# Patient Record
Sex: Male | Born: 1995 | Race: White | Hispanic: No | Marital: Single | Smoking: Never smoker
Health system: Southern US, Community
[De-identification: ages and names within clinical notes are randomized; demographics above are authoritative.]

## PROBLEM LIST (undated history)

## (undated) DIAGNOSIS — R569 Unspecified convulsions: Secondary | ICD-10-CM

## (undated) DIAGNOSIS — K5909 Other constipation: Secondary | ICD-10-CM

## (undated) DIAGNOSIS — K219 Gastro-esophageal reflux disease without esophagitis: Secondary | ICD-10-CM

## (undated) DIAGNOSIS — F840 Autistic disorder: Secondary | ICD-10-CM

## (undated) DIAGNOSIS — F419 Anxiety disorder, unspecified: Secondary | ICD-10-CM

## (undated) HISTORY — DX: Autistic disorder: F84.0

## (undated) HISTORY — DX: Other constipation: K59.09

## (undated) HISTORY — DX: Gastro-esophageal reflux disease without esophagitis: K21.9

## (undated) HISTORY — DX: Unspecified convulsions: R56.9

## (undated) HISTORY — PX: WISDOM TOOTH EXTRACTION: SHX21

## (undated) HISTORY — DX: Anxiety disorder, unspecified: F41.9

---
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
## 2014-01-01 NOTE — Assessment & Plan Note (Signed)
Refilled Miralax today.

## 2014-01-06 ENCOUNTER — Telehealth: Payer: Self-pay | Admitting: *Deleted

## 2014-01-06 NOTE — Telephone Encounter (Signed)
Antonio Silva, mom, states that the patient is completely out of hydroxyzine and needs a prior authorization. She stated the pharmacy gave her just enough for a few days and does not know if he has enough for tonight. He has had some sleeping problems. The mother can be reached at [phone].

## 2014-01-06 NOTE — Telephone Encounter (Signed)
Thank you for taking care of that. TG

## 2014-01-06 NOTE — Telephone Encounter (Signed)
[facility] The medication does not require PA, just needs refill authorization. I gave verbal authorization to refill this one time. Pt has appt w Dr. Rexene EdisonH tomorrow. Called mom and lvm to let her know [facility] and reminded her of pt's appt tomorrow at 12 pm w arrival at 11:45 am.

## 2014-01-07 ENCOUNTER — Encounter: Payer: Self-pay | Admitting: Pediatrics

## 2014-01-07 ENCOUNTER — Ambulatory Visit (INDEPENDENT_AMBULATORY_CARE_PROVIDER_SITE_OTHER): Payer: Medicaid Other | Admitting: Pediatrics

## 2014-01-07 VITALS — Ht 67.0 in | Wt 170.0 lb

## 2014-01-07 DIAGNOSIS — G40309 Generalized idiopathic epilepsy and epileptic syndromes, not intractable, without status epilepticus: Secondary | ICD-10-CM

## 2014-01-07 DIAGNOSIS — K219 Gastro-esophageal reflux disease without esophagitis: Secondary | ICD-10-CM

## 2014-01-07 DIAGNOSIS — F411 Generalized anxiety disorder: Secondary | ICD-10-CM

## 2014-01-07 DIAGNOSIS — F840 Autistic disorder: Secondary | ICD-10-CM

## 2014-01-07 DIAGNOSIS — K5900 Constipation, unspecified: Secondary | ICD-10-CM

## 2014-01-07 DIAGNOSIS — F419 Anxiety disorder, unspecified: Secondary | ICD-10-CM

## 2014-01-07 DIAGNOSIS — K5909 Other constipation: Secondary | ICD-10-CM

## 2014-01-07 MED ORDER — SERTRALINE HCL 20 MG/ML PO CONC: ORAL | Status: DC

## 2014-01-07 NOTE — Progress Notes (Signed)
Patient: Antonio Silva MRN: [id number] Sex: male DOB: Jun 13, 1996  Provider: Deetta PerlaHICKLING,Antonio H, MD [facility]  Note type: Routine return visit  History of Present Illness: Referral Source: [facility]ce Silva [facility] History from: mother and CAPs worker and [facility] Chief Complaint: Autism/Seizures   Antonio Silva is a 18 y.o. male who eturns for evaluation and management of autism, anxiety, and seizures.  Antonio Silva returns on January 07, 2014 for the first time since May 24, 2013.  He has a history of generalized tonic-clonic seizures and undifferentiated autism.  He was placed on lamotrigine as result of seizures and this has completely controlled them.  Unfortunately, with control of his seizures, he has become more anxious and more agitated, and more difficult to control.  When anxious he paces, he shakes, he engages in self-injurious, and at times aggressive behavior.  He will slap and scream.  Sometimes the episodes are quite brief and Silva times they can last for a half hour to an hour.  He has stereotypic behavior of pushing his fingers into his closed eyelids.  His mother and caregiver are worried that he is going to injure himself.    He has been treated with 5 mg of Valium on days when he is agitated and this seems to work quite well.  He becomes calm and not sleepy.  His mother and caregiver know that this, however, could be problematic if given daily.  He seems to them to be excessively sleepy.  He may sleep a full night and then sleep for three to four hours further during the day.  He is on number of medications that might be sleep inducing.  During the week day, his caregiver pushes him to get his day program.  On the weekends, he tends to sleep in.  Overall he seems to have less motivation and appears to be less happy.    He is followed by [facility].  Dr. Adriana Silva was not able to recently assess him because he  refused to enter the clinic.  He had a similar problem today, but we were able to get him into the office.  I blocked the door so he could not leave.  He paced back and forth and then he began to cry.  It seems to me that he was quite frustrated that he could not leave, but fortunately he did not become aggressive.  He did not, however, allow me to examine him Silva than in the most superficial way.  Once we were finished, he broke into a smile and was only too happy to leave the office.  Review of Systems: 12 system review was remarkable for anxiety and excessive sleep  Past Medical History  Diagnosis Date  . Seizures   . Autistic disorder, current or active state   . GERD (gastroesophageal reflux disease)   . Chronic constipation   . Anxiety    Hospitalizations: no, Head Injury: no, Nervous System Infections: no, Immunizations up to date: yes Past Medical History Diagnosis of autism was made when the patient was 527 months of age.  His most recent non-verbal IQ was estimated at 5142.  Fractured arm at 9.  Hospitalization due to fecal impaction for one night  Allergies to dust mites and mold.  Behavior issues have worsened during puberty.  Significant problems with constipation and takes MiraLAX twice daily.   Birth History 6 lbs. 11 oz. infant born at full-term  to a 19 year old primigravida.  Mother gained 30 pounds  Labor lasted for 10 hours  Normal spontaneous vaginal delivery  Delayed developmental milestones particularly for language.  Behavior History the patient is anxious, as low frustration tolerance, has a strong eversion to change in routine and new settings, and becomes violent when he is upset.  Surgical History No past surgical history on file.  Family History family history includes Asthma in his mother; Breast cancer in his paternal grandmother; Depression in his mother; Diabetes in his mother; Heart attack in his maternal grandfather and maternal grandmother;  Hyperlipidemia in his mother; Hypertension in his mother; Ulcerative colitis in his father. Family History is negative for migraines, seizures, intellectual disability, blindness, deafness, birth defects, chromosomal disorder, or autism.  Social History History   Social History  . Marital Status: Single    Spouse Name: N/A    Number of Children: N/A  . Years of Education: N/A   Social History Main Topics  . Smoking status: Never Smoker   . Smokeless tobacco: Never Used  . Alcohol Use: No  . Drug Use: No  . Sexual Activity: No   Silva Topics Concern  . None   Social History Narrative  . None   Educational level special education Living with both parents  Hobbies/Interest: Enjoys walking and community activities. School comments Antonio Silva attends [employer], he has days where he does well and Silva days not so well.   Current Outpatient Prescriptions on File Prior to Visit  Medication Sig Dispense Refill  . diazepam (VALIUM) 5 MG tablet TAKE 1 TABLET BY MOUTH EVERY 6 HOURS AS NEEDED FOR ANXIETY  30 tablet  0  . hydrOXYzine (ATARAX) 10 MG/5ML syrup Give 25 ml at bedtime  775 mL  5  . lamoTRIgine (LAMICTAL) 25 MG tablet 3 BY MOUTH TWICE A DAY  186 tablet  1  . lansoprazole (PREVACID SOLUTAB) 30 MG disintegrating tablet Take 30 mg by mouth daily.      . polyethylene glycol (MIRALAX / GLYCOLAX) packet Take 17 g by mouth 2 (two) times daily.      . risperiDONE (RISPERDAL) 1 MG/ML oral solution TAKE 1.5 MLS BY MOUTH EVERY MORNING AT 7:00 AM AND 5:00 PM.  90 mL  5  . sertraline (ZOLOFT) 20 MG/ML concentrated solution GIVE 1 ML BY MOUTH EVERY MORNING AND 0.5 ML BY MOUTH EVERY NIGHT AT BEDTIME.  60 mL  2  . lansoprazole (PREVACID SOLUTAB) 30 MG disintegrating tablet Take 1 tablet (30 mg total) by mouth daily.  90 tablet  3  . polyethylene glycol powder (GLYCOLAX/MIRALAX) powder Take 17 g by mouth 2 (two) times daily as needed.  3350 g  1  . risperiDONE (RISPERDAL) 1 MG/ML oral  solution TAKE 1.5 MLS BY MOUTH EVERY MORNING AT 7:00 AM AND 5:00 PM.  90 mL  1   No current facility-administered medications on file prior to visit.   The medication list was reviewed and reconciled. All changes or newly prescribed medications were explained.  A complete medication list was provided to the patient/caregiver.  Allergies  Allergen Reactions  . Silva     Lactose    Physical Exam Ht 5\' 7"  (1.702 m)  Wt 170 lb (77.111 kg)  BMI 26.62 kg/m2  It was difficult to get the patient into the room.  He would not sit down and continued to pace back and forth.  When I positioned myself to block the exit, he began to cry.  It  was possible to distract him with toys.  He has normal strength, normal gait, full visual fields, turns to localize sound, symmetric facial strength.  I was unable to examine him further.  Assessment 1. 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 2. Generalized convulsive epilepsy, 345.10. 3. Anxiety, 300.00. 4. Chronic constipation, 564.00. 5. Gastroesophageal reflux without esophagitis, 530.81.  Plan I recommended increasing sertraline to 20 mg in the morning and 10 mg at nighttime.  He has experienced difficulty with increasing SSRIs before with seizure activity or seizure-like activity.  I believe that lamotrigine is helping to prevent him from having seizures and that he might be able to tolerate the SSRI better.  I also have little evidence that SSRIs induced seizures.  He was here today with his mother and his CAPS worker who works with him on nearly a daily basis.  I will plan to see him in six months' time.  I asked his mother to call me and let me know if sertraline helps his anxiety or in some way makes matters worse.  If that is the case, I will recommend that the family return to the care of Dr. Tora Duck who was providing able care as regards his significant and  severe problems with behavior.  Antonio Silva will return in six months in followup.  I will see him sooner depending upon clinical need.  I spent 30 minutes of face-to-face time with Elman and his mother and caregiver more than half of it in consultation.  Deetta Perla MD

## 2014-01-22 ENCOUNTER — Ambulatory Visit (INDEPENDENT_AMBULATORY_CARE_PROVIDER_SITE_OTHER): Payer: Medicaid Other | Admitting: Family Medicine

## 2014-01-22 DIAGNOSIS — H6091 Unspecified otitis externa, right ear: Secondary | ICD-10-CM

## 2014-01-22 DIAGNOSIS — H6090 Unspecified otitis externa, unspecified ear: Secondary | ICD-10-CM | POA: Insufficient documentation

## 2014-01-22 DIAGNOSIS — H60399 Other infective otitis externa, unspecified ear: Secondary | ICD-10-CM

## 2014-01-22 MED ORDER — NEOMYCIN-POLYMYXIN-HC 3.5-10000-1 OT SOLN: 4.0000 [drp] | Freq: Three times a day (TID) | OTIC | Status: DC

## 2014-01-22 NOTE — Progress Notes (Signed)
   Subjective:    Patient ID: Antonio Silva A Hardman, male    DOB: 1996/06/17, 18 y.o.   MRN: [id number]  HPI 18 year old male with Epilepsy and Severe Autism spectrum disorder presents for evaluation of ear pain.  1) Ear pain - Patient has severe autism and is nonverbal.   - Parents are concerned about potential ear infection.  He reports he has been pulling/messing with both ears since yesterday.  Of note, they recently came back from a beach trip.  They deny any swimming by the patient.  However, they report that the wind was blowing heavily and that he may have gotten sand in his ears.  - Additionally, they note that his behavior has been more aggressive and he has been having frequent "meltdowns". - No reported fever or ear drainage.  Review of Systems Per HPI    Objective:   Physical Exam Unable to obtain VS today.  Exam: General: well, well-nourished; no acute distress. HEENT: NCAT. Right ear - canal erythematous and inflamed.  Was unable to visualize tympanic membrane due to patient resistance. Left ear - normal canal and TM. Psych: Nonverbal, pacing around the room, intermittently combative with father; patient also hit table several times during office visit.      Assessment & Plan:  See Problem List

## 2014-01-22 NOTE — Assessment & Plan Note (Signed)
Physical exam very difficult secondary to underlying autism.  No foreign bodies were seen.  Evidence of otitis externa in the right ear was noted with significant erythema of the canal. Advised PRN use of tylenol and/or motrin for pain.  Rx sent for Cortisporin otic.  Followup instructions given to family.

## 2014-01-23 ENCOUNTER — Other Ambulatory Visit: Payer: Self-pay | Admitting: Family Medicine

## 2014-01-23 ENCOUNTER — Telehealth: Payer: Self-pay | Admitting: Family Medicine

## 2014-01-23 MED ORDER — CIPROFLOXACIN HCL 500 MG PO TABS: 500.0000 mg | ORAL_TABLET | Freq: Two times a day (BID) | ORAL | Status: DC

## 2014-01-23 MED ORDER — AMOXICILLIN 400 MG/5ML PO SUSR: 500.0000 mg | Freq: Two times a day (BID) | ORAL | Status: DC

## 2014-01-23 MED ORDER — LANSOPRAZOLE 30 MG PO TBDP: 30.0000 mg | ORAL_TABLET | Freq: Every day | ORAL | Status: DC

## 2014-01-23 NOTE — Telephone Encounter (Signed)
The cipro tablets crushed up taste terrible.  They are still not able to get the ear drops in. In the past amoxicillian (pink kind) has worked. Can that be prescribed for him? Please advise

## 2014-01-23 NOTE — Telephone Encounter (Signed)
Mother calls, patient severely autistic. She states that it is taking 3 people to hold patient down to get ear drops in. She would like to know if there is something oral that she can crush up in his juice or smoothie and give him rather then the ear drops? Also, mother states that pharmacy advised her that Prevacid dissolvable tabs will need a PA. Should be fax to office soon.

## 2014-01-23 NOTE — Telephone Encounter (Signed)
Rx for cipro sent. It can be crushed and put in food/liquid. It does need to be avoid with other medications (do not take within 1-2 hours of other medications).

## 2014-01-23 NOTE — Telephone Encounter (Signed)
Left voice message regarding Dr. Patsey Bertholdook's message informing of new [facility]  Clovis PuMartin, Clara Herbison L, RN

## 2014-01-23 NOTE — Telephone Encounter (Signed)
Father called and needs a refill sent in on his son's medication Prevacid. The pharmacy gave them a couple of pills until they get the prescription. jw

## 2014-02-02 ENCOUNTER — Other Ambulatory Visit: Payer: Self-pay | Admitting: Family

## 2014-02-03 ENCOUNTER — Other Ambulatory Visit: Payer: Self-pay | Admitting: Family

## 2014-02-07 ENCOUNTER — Telehealth: Payer: Self-pay | Admitting: *Deleted

## 2014-02-07 NOTE — Telephone Encounter (Signed)
The mother stated the seizure started at 6:30 pm. The mother stated the pt was in the backyard sitting in a seat. The mother was there when it happened. The mother said she saw the pt fall over on to the ground. The mother said she rushed to  The pt and put him on right side. The mother stated the patient's eyes were fixed, and his whole body was jerking. The mother said the pt vomited 2 times. The seizure lasted 3 minutes. The mother said she helped him up and stated that the pt was disoriented and his walking was wobbly. The mother called EMS. While she was waiting she helped walk to the house. The mother stated the pt wanted to pace around the house and had to follow him. When the EMS came, the pt was combative. The EMS was able to take only his blood sugar which was 133 non-fasting. The mother said the EMS could not get his blood pressure or pulse. The mother stated the pt took his first dose of lamictal and he missed his second dose. The mother said he was tired and fell asleep around 7:30 pm-8:00 pm. She said the pt slept all night. The mother said this morning the pt did not want to eat or drink anything, so she could not give him his morning dose of lamictal. She said he woke up with a little nose bleed.

## 2014-02-07 NOTE — Telephone Encounter (Signed)
Reviewed and agree. I will hold onto this until Monday.

## 2014-02-07 NOTE — Telephone Encounter (Signed)
[name], mom stated that the pt had a grand mal seizure last night. She said it was the first one in 2 years. The mother wanted to schedule an appointment as soon as possible. I left a message to get more details at 8:33 am. The mother can be reached at [phone].

## 2014-02-07 NOTE — Telephone Encounter (Signed)
I called Mom. She said that Antonio Silva was tired and irritable this morning, still refusing food or liquids. She has not been able to give him any medication. She wonders if the seizure means something new is wrong with him. She said that he has not missed medication recently, other than around 2 weeks ago when he an ear infection when he was irritable and missed 1 dose. She said that he slept poorly 2 nights ago. I told Mom that sometimes even with compliance, breakthrough seizures happen. She said that she has noticed that each time Sertraline has been increased that about 1 mo later, breakthrough seizures occur. She said that he has been more anxious and that Dad has wondered if Sertraline is effective at all. We talked about seizures and anxiety, Sertraline, and difficulty of Antonio Silva being unable to self report. I told Mom that Dr Sharene SkeansHickling was out of the office until Monday. For now, I told her to continue to try to get him to take Lamotrigine doses and to try to get him to take a Diazepam dose, as that may help as well. Mom said that she was afraid that would cause a seizure and I explained to her that it was give as treatment to stop seizures in some causes. She agreed with this plan and will await Dr Darl HouseholderHickling's call on Monday. Mom's number is [phone]. TG

## 2014-02-10 NOTE — Telephone Encounter (Signed)
I spoke with mother for 6-1/2 minutes.  She took her son off Risperdal and sertraline and he seems to be happier and more spontaneous.  He has not had periods of agitation or aggression.  I would leave lamotrigine where it that even though he just recently had a seizure.  We may increase it if he has another one soon.  I told her to call me if there were further seizures or behavioral problems off of the medications.

## 2014-02-11 ENCOUNTER — Telehealth: Payer: Self-pay | Admitting: Family

## 2014-02-11 NOTE — Telephone Encounter (Signed)
[name] called about Antonio Silva. She called to let Dr Sharene SkeansHickling know that the good behavior didn't last. She said that yesterday he had major meltdown and became aggressive. She gave Valium he settled some but few hours later he had meltdown and became aggressive again. She gave Risperdal 1ml, not 1  ml because he was tired on that dose before. She gave more this morning because she can't have him being aggressive and possibly destructive. She is disappointed that his good behavior did not last and that she had to resort to giving Risperdal. She is not giving Sertraline. I called Mom back and talked with her. I told her that restarting Risperidone was ok in this situation and explained that it can be given in low doses and monitored for safety and side effects. She said that he had been able to go to day program today and was calm and doing well so far. I asked her to continue to let us know how he was doing. I put Risperidone back on his medication list. Mom can be reached at [phone]. TG

## 2014-02-11 NOTE — Telephone Encounter (Signed)
Noted, I agree with this plan. 

## 2014-02-17 ENCOUNTER — Telehealth: Payer: Self-pay | Admitting: Family

## 2014-02-17 DIAGNOSIS — F840 Autistic disorder: Secondary | ICD-10-CM

## 2014-02-17 MED ORDER — RISPERIDONE 1 MG/ML PO SOLN: ORAL | Status: DC

## 2014-02-17 NOTE — Telephone Encounter (Signed)
15 minute phone call with father.  We decided to increase his risperidone at midday.  I think that his dose is wearing off.  He seems to have his biggest problem when he makes a transition to home.  He will need a letter for the daycare workers.

## 2014-02-17 NOTE — Telephone Encounter (Signed)
Dad Antonio PasseJimmy Silva left message saying that he had concerns about Antonio Silva. I called him back and he said that Antonio Silva has been off Setraline for 2 weeks. He has been having meltdowns, hitting himself in head, hitting others. He had one last night at 2AM.  They have been giving him Valium at times when he has been agitated. He is taking Risperidone and Dad thinks it has helped but he continues to have episodes. The meltdowns tend to happen in the evening and at night, not during the day. He goes to day program during the day and has had some aggressive behavior there, but it has been manageable. Dad thinks he is having anxiety but is concerned about restarting Sertraline because of seizures.   Dad's other concern is about a behavior that Antonio Silva does with his eyes. He says that he has been putting his fingers to the corner of his eyes and holding pressure. He does this to one eye only at a time, varies which eye he does. Dad says that he has done it since the 2 years that he has been on Lamictal. Dad wonders if he has blurred vision, or dizzy, or has eye pain that he cannot verbalize. Dad wants to talk to Dr Antonio Silva and can be reached at [phone]. TG

## 2014-02-19 ENCOUNTER — Telehealth: Payer: Self-pay | Admitting: Family

## 2014-02-19 NOTE — Telephone Encounter (Signed)
Dad Gwinda PasseJimmy Minkoff left message saying that he needed to update Dr Sharene SkeansHickling about [name]. I called him back and he said he had more meltdowns after he spoke with Dr Sharene SkeansHickling yesterday. He said that CambodiaJamison came home from day program yesterday and seemed to be on edge and unable to tolerate his own skin, end up having meltdowns last night despite being given Valium. Dad said that the last 7 days had been miserable, and that he felt that he needed the Sertraline back, so he restarted it at 20mg . He said that in addition to the meltdowns, that the Sertraline seemed to help his sexual behavior that was sometimes inappropriate, so he decided to restart it. He is going to wait to add in the additional dose of Risperidone as he and Dr Sharene SkeansHickling discussed yesterday, and is aware that the Sertraline will take a few days to build to a therapeutic level. He said that there is no need for Dr Sharene SkeansHickling to call back, he just wanted to let him know of his decision. TG

## 2014-02-19 NOTE — Telephone Encounter (Signed)
Noted, I agree with this plan. 

## 2014-02-28 NOTE — Telephone Encounter (Signed)
Noted, very pleased.

## 2014-02-28 NOTE — Telephone Encounter (Signed)
Dad Gwinda PasseJimmy Mcclain called to let Dr Sharene SkeansHickling know that since they restarted Sertraline that they seen a big improvement in his behavior, with no meltdowns. He just wanted to let Dr Sharene SkeansHickling know. TG

## 2014-04-14 ENCOUNTER — Other Ambulatory Visit: Payer: Self-pay | Admitting: Family

## 2014-04-28 ENCOUNTER — Other Ambulatory Visit: Payer: Self-pay | Admitting: Family

## 2014-05-19 ENCOUNTER — Other Ambulatory Visit: Payer: Self-pay | Admitting: *Deleted

## 2014-05-19 MED ORDER — LANSOPRAZOLE 30 MG PO TBDP: 30.0000 mg | ORAL_TABLET | Freq: Every day | ORAL | Status: DC

## 2014-05-28 ENCOUNTER — Ambulatory Visit (INDEPENDENT_AMBULATORY_CARE_PROVIDER_SITE_OTHER): Payer: Medicaid Other | Admitting: *Deleted

## 2014-05-28 ENCOUNTER — Ambulatory Visit: Payer: Medicaid Other | Admitting: *Deleted

## 2014-05-28 DIAGNOSIS — Z23 Encounter for immunization: Secondary | ICD-10-CM

## 2014-07-01 ENCOUNTER — Other Ambulatory Visit: Payer: Self-pay | Admitting: Family

## 2014-07-23 ENCOUNTER — Other Ambulatory Visit: Payer: Self-pay | Admitting: Family Medicine

## 2014-07-23 NOTE — Telephone Encounter (Signed)
Mother called and said that her son needs a refill on his Miralax called in. jw

## 2014-07-24 ENCOUNTER — Encounter: Payer: Self-pay | Admitting: *Deleted

## 2014-07-24 MED ORDER — POLYETHYLENE GLYCOL 3350 17 G PO PACK: 17.0000 g | PACK | Freq: Two times a day (BID) | ORAL | Status: DC

## 2014-07-24 NOTE — Progress Notes (Signed)
PA pending for Prevacid per [employer]. Confirmation number [id number] Charmayne SheerW. .Pleshette Tomasini L, RN

## 2014-07-24 NOTE — Progress Notes (Signed)
[facility]. Formulary and PA form placed in provider box for completion. Martin, Tamika L, RN  

## 2014-07-24 NOTE — Telephone Encounter (Signed)
Rx sent 

## 2014-07-25 NOTE — Progress Notes (Signed)
Received PA approval for Prevacid via [employer].  Med approved for 07/24/14 - 07/24/15.  [facility].  PA confirmation number [id number] Link SnufferW. Martin, Bronson Ingamika L, RN

## 2014-07-30 ENCOUNTER — Ambulatory Visit (INDEPENDENT_AMBULATORY_CARE_PROVIDER_SITE_OTHER): Payer: Medicaid Other | Admitting: Pediatrics

## 2014-07-30 VITALS — HR 96 | Ht 67.0 in | Wt 177.7 lb

## 2014-07-30 DIAGNOSIS — F840 Autistic disorder: Secondary | ICD-10-CM

## 2014-07-30 DIAGNOSIS — G4720 Circadian rhythm sleep disorder, unspecified type: Secondary | ICD-10-CM | POA: Insufficient documentation

## 2014-07-30 DIAGNOSIS — K5900 Constipation, unspecified: Secondary | ICD-10-CM

## 2014-07-30 DIAGNOSIS — G40309 Generalized idiopathic epilepsy and epileptic syndromes, not intractable, without status epilepticus: Secondary | ICD-10-CM

## 2014-07-30 DIAGNOSIS — G4700 Insomnia, unspecified: Secondary | ICD-10-CM

## 2014-07-30 DIAGNOSIS — K5909 Other constipation: Secondary | ICD-10-CM

## 2014-07-30 MED ORDER — LAMOTRIGINE 25 MG PO TABS: ORAL_TABLET | ORAL | Status: DC

## 2014-07-30 MED ORDER — HYDROXYZINE HCL 10 MG/5ML PO SYRP: ORAL_SOLUTION | ORAL | Status: DC

## 2014-07-30 MED ORDER — RISPERIDONE 1 MG/ML PO SOLN
ORAL | Status: DC
Start: 2014-07-30 — End: 2014-10-04

## 2014-07-30 MED ORDER — SERTRALINE HCL 20 MG/ML PO CONC: ORAL | Status: DC

## 2014-07-30 MED ORDER — DIAZEPAM 5 MG PO TABS: ORAL_TABLET | ORAL | Status: DC

## 2014-07-30 NOTE — Progress Notes (Signed)
Patient: Antonio Silva MRN: [id number] Sex: male DOB: [birth year]  Provider: Deetta Perla, MD Location of Care: [facility]  Note type: Routine return visit  History of Present Illness: Referral Source: Dr. Bradley Ferris History from: mother and [facility] chart Chief Complaint: Autism Spectrum Disorder/Epilesy/Aniexty   Antonio Silva is a 19 y.o. male who was evaluated on July 30, 2014, for the first time since February 06, 2014.  He has autism spectrum disorder with impairment of expressive and receptive language, and intellectual disability.  He is dependent on others for his care.  Although, he has number of self-help skills including feeding, bathing, dressing, and folding laundry.  He is not able to independently clean himself when he is soiled.  He has generalized tonic-clonic seizures that have been well controlled.  He has problems with agitation particularly when there is a change in his usual structured activities.  His major medical problem is constipation and gastroesophageal reflux.  He was seen at [facility] and an antispasmodic agent was added to MiraLax and Prevacid.  Mother has yet to fill it.  He takes and tolerates lamotrigine without side effects.  He continues to have some self-injurious behavior pushing his eyelid inwards.  It is not clear if this is a habit if he is responding to some sort of irritation or pain.  He has not caused any obvious injury to his tissues.  His appetite is large.  He has gained another 7.7 pounds in six months.  He goes to bed between 9 and 9:30, but at least two or three times a week, he has arousals that can be in anywhere as early as midnight or as late as 5 a.m.  When he wakes up he tends to remain awake.  Review of Systems: 12 system review was remarkable for blurred vision, seizures and anxiety   Past Medical History Diagnosis Date  . Seizures   . Autistic disorder, current or active state   . GERD  (gastroesophageal reflux disease)   . Chronic constipation   . Anxiety    Hospitalizations: Yes.  , Head Injury: No., Nervous System Infections: No., Immunizations up to date: Yes.    GI issues are treated at [facility].   Birth History 6 lbs. 11 oz. infant born at full-term to a 20 year old primigravida.  Mother gained 30 pounds  Labor lasted for 10 hours  Normal spontaneous vaginal delivery  Delayed developmental milestones particularly for language.  Behavior History none  Surgical History History reviewed. No pertinent past surgical history.  Family History family history includes Asthma in his mother; Breast cancer in his paternal grandmother; Depression in his mother; Diabetes in his mother; Heart attack in his maternal grandfather and maternal grandmother; Hyperlipidemia in his mother; Hypertension in his mother; Ulcerative colitis in his father. Family history is negative for migraines, seizures, intellectual disabilities, blindness, deafness, birth defects, chromosomal disorder, or autism.  Social History . Marital Status: Single    Spouse Name: N/A    Number of Children: N/A  . Years of Education: N/A   Social History Main Topics  . Smoking status: Never Smoker   . Smokeless tobacco: Never Used  . Alcohol Use: No  . Drug Use: No  . Sexual Activity: No   Social History Narrative  Educational level special education Living with both parents  Hobbies/Interest: Enjoys walking  School comments Antonio Silva completed his education at [employer] in 2015 he now attends [employer].  Allergies Allergen Reactions  . Other     Lactose   Physical Exam Pulse 96  Ht 5\' 7"  (1.702 m)  Wt 177 lb 11.2 oz (80.604 kg)  BMI 27.83 kg/m2  General: alert, well developed, well nourished, in no acute distress, brown hair, blue eyes, right handed Head: normocephalic, no dysmorphic features Respiratory: auscultation clear Cardiovascular:  no murmurs, pulses are normal Musculoskeletal: no skeletal deformities or apparent scoliosis Skin: no rashes or neurocutaneous lesions  Neurologic Exam  Mental Status: alert; Antonio Silva was wary of me that was generally activities.  He only sat down once.  He tended to pace the floor.  He tried to push my hands away as I examined him up for the most part allowed me to assess him. Cranial Nerves: visual fields are full to double simultaneous stimuli; extraocular movements are full and conjugate; pupils are round reactive to light; symmetric facial strength; midline tongue and uvula; he will localize sound bilaterally Motor: Normal functional strength, tone and mass; good fine motor movements Coordination: No tremor or dysmetria Gait and Station: normal gait and station Reflexes: symmetric and normal bilaterally in his arms  Assessment 1. Generalized convulsive epilepsy, G40.309. 2. Autism spectrum disorder with accompanying intellectual impairment, requiring very substantial support (level 3), F84.0. 3. Chronic constipation, K59.00. 4. 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  Discussion I am pleased that Neizan's seizures are well-controlled without significant side effects.  He seemed to be calmer today than on last visit, but he was still wary and it was difficult to examine him.  Plan Prescriptions were refilled for lamotrigine, Risperdal, hydroxyzine, sertraline, and Valium.  There is nothing that I can do about his early morning arousals.  There is no medicine that I know that will keep the patient asleep hours after initially falling asleep.  I spent 30 minutes of face-to-face time with Antonio Silva and his mother more than half of it in consultation.   Medication List   This list is accurate as of: 07/30/14 10:02 AM.       Amoxicillin 400 MG/5ML suspension  Commonly known as:  AMOXIL  Take 6.3 mLs (500 mg total) by mouth 2 (two) times daily.     ciprofloxacin 500 MG tablet  Commonly known as:   CIPRO  Take 1 tablet (500 mg total) by mouth 2 (two) times daily. You may crush and put in food/liquid.  Do not take with other medications.     diazepam 5 MG tablet  Commonly known as:  VALIUM  TAKE 1 TABLET BY MOUTH EVERY 6 HOURS AS NEEDED FOR ANXIETY.     hydrOXYzine 10 MG/5ML syrup  Commonly known as:  ATARAX  TAKE 25 MLS BY MOUTH EVERY NIGHT AT BEDTIME     lamoTRIgine 25 MG tablet  Commonly known as:  LAMICTAL  TAKE 3 TABLETS BY MOUTH TWICE A DAY.     lansoprazole 30 MG disintegrating tablet  Commonly known as:  PREVACID SOLUTAB  Take 1 tablet (30 mg total) by mouth daily.     neomycin-polymyxin-hydrocortisone otic solution  Commonly known as:  CORTISPORIN  Place 4 drops into the right ear 3 (three) times daily.     polyethylene glycol packet  Commonly known as:  MIRALAX / GLYCOLAX  Take 17 g by mouth 2 (two) times daily.     risperiDONE 1 MG/ML oral solution  Commonly known as:  RISPERDAL  TAKE 1.5 MLS BY MOUTH EVERY MORNING AT 7 AM AND AT 5 PM     sertraline 20 MG/ML  concentrated solution  Commonly known as:  ZOLOFT  Give 1ml daily      The medication list was reviewed and reconciled. All changes or newly prescribed medications were explained.  A complete medication list was provided to the patient/caregiver.  Deetta Perla, MD

## 2014-08-11 ENCOUNTER — Telehealth: Payer: Self-pay

## 2014-08-11 NOTE — Telephone Encounter (Signed)
Antonio Silva, mother, she asked that I mail the completed forms for [employer] to her home. I confirmed address and placed in mail as requested. Placed copies at front desk for scanning.

## 2014-08-21 ENCOUNTER — Other Ambulatory Visit: Payer: Self-pay | Admitting: Family Medicine

## 2014-09-16 ENCOUNTER — Telehealth: Payer: Self-pay | Admitting: Family

## 2014-09-16 NOTE — Telephone Encounter (Signed)
Mom Stanton KidneyDonna Windish left message about Laurence FerrariJamison Imai. Mom said that she just found out from someone at [facility] that Cannabis oil is available in [place] and Mom is very interested in it since is supposed to be safer and have less side effects that the traditional seizure medications. Please call Mom at [phone] or at home [phone]. TG

## 2014-09-16 NOTE — Telephone Encounter (Signed)
Antonio Silva has only had 3 seizures in Antonio Silva life.  He would not qualify for the use of CBD oil.  It is not available in the pharmacy grade oil.  We can prescribe cannabis, but we don't know what are patient's are getting.  This is all risk with very little reward.

## 2014-10-04 ENCOUNTER — Other Ambulatory Visit: Payer: Self-pay | Admitting: Family

## 2014-10-30 ENCOUNTER — Telehealth: Payer: Self-pay | Admitting: Family

## 2014-10-30 DIAGNOSIS — Z79899 Other long term (current) drug therapy: Secondary | ICD-10-CM

## 2014-10-30 NOTE — Telephone Encounter (Signed)
I left a detailed message.  I'll be happy to order a fasting glucose and hemoglobin A1c.  He may need more if these turn out to be positive.  I told her that if she called back before 5 that I would call her back today.

## 2014-10-30 NOTE — Telephone Encounter (Signed)
Mom called back and apologized from missing your call. She asked that you call her back to discuss further - especially the logistics of how to get the blood drawn done. Mom thinks that he will need to be sedated. Please call Mom at [phone]. TG

## 2014-10-30 NOTE — Telephone Encounter (Signed)
14 minute phone call.  I'm very concerned about his anxiety.  I outlined that we can sedate him to a point where be willing to the restrained have a tonic and put around his arm and a needle put into it.  I recommended that we perform a urinalysis to see if he has glycosuria.  If he does, then further testing is necessary if he doesn't, then it's probably not necessary.  I will order a urinalysis.  Mother will have to go get a container and then have the sample brought to the laboratory.  We will use [facility].I told her to call Marcelino DusterMichelle for the logistics.

## 2014-10-30 NOTE — Telephone Encounter (Signed)
Mom Stanton KidneyDonna Daoust left message about [name]. She said that he has symptoms of high blood sugar including very thirsty, frequent urination, and a sweet smell to his urine. Wants to talk to you about getting fasting blood panel, including hemoglobin A1C done. He has to be sedated, but Mom feels that he needs to have blood drawn asap. Please call Mom at [phone]. TG

## 2014-11-12 ENCOUNTER — Telehealth: Payer: Self-pay | Admitting: Family

## 2014-11-12 NOTE — Telephone Encounter (Signed)
Antonio Silva Antonio Silva left message about Antonio Silva. She said that she has learned about place for him to get dental work and lab studies while sedated. She said that [facility] does medically complicated cases at outpatient surgery center. The dentist is Antonio Silva and the phone number is [phone]. She said that they only see patients by referral and would need a referral from Antonio Silva for Antonio Silva to be seen there. Please call Antonio Silva to discuss at mobile [phone] or home [phone]. Antonio Silva

## 2014-11-12 NOTE — Telephone Encounter (Signed)
I need to find out whether a letter needs to be sent or whether there is some other form that needs to be completed.I left a message for mother to call back.

## 2014-11-13 NOTE — Telephone Encounter (Signed)
Noted, thanks!

## 2014-11-13 NOTE — Telephone Encounter (Signed)
Mom called back and said that she didn't have any information about referrals other than the phone number. I called [facility] and they said that they are not accepting referrals from physicians outside [facility] at this time. I called Mom to let her know and she said that she would call his usual dentist and perhaps Dr Lamar [facility]Klinepeter at [facility] to see if something else can be worked out. She will let me know if she needs anything from this office. TG

## 2014-12-09 ENCOUNTER — Other Ambulatory Visit: Payer: Self-pay | Admitting: Family Medicine

## 2015-01-10 ENCOUNTER — Other Ambulatory Visit: Payer: Self-pay | Admitting: Family Medicine

## 2015-01-10 ENCOUNTER — Other Ambulatory Visit: Payer: Self-pay | Admitting: Pediatrics

## 2015-02-06 ENCOUNTER — Other Ambulatory Visit: Payer: Self-pay | Admitting: Family

## 2015-02-09 ENCOUNTER — Other Ambulatory Visit: Payer: Self-pay | Admitting: Pediatrics

## 2015-02-18 ENCOUNTER — Ambulatory Visit: Payer: Medicaid Other | Admitting: Pediatrics

## 2015-03-11 ENCOUNTER — Ambulatory Visit (INDEPENDENT_AMBULATORY_CARE_PROVIDER_SITE_OTHER): Payer: Medicaid Other | Admitting: Pediatrics

## 2015-03-11 VITALS — Ht 67.0 in | Wt 172.0 lb

## 2015-03-11 DIAGNOSIS — F411 Generalized anxiety disorder: Secondary | ICD-10-CM

## 2015-03-11 DIAGNOSIS — G40309 Generalized idiopathic epilepsy and epileptic syndromes, not intractable, without status epilepticus: Secondary | ICD-10-CM | POA: Diagnosis not present

## 2015-03-11 DIAGNOSIS — G4700 Insomnia, unspecified: Secondary | ICD-10-CM

## 2015-03-11 DIAGNOSIS — F802 Mixed receptive-expressive language disorder: Secondary | ICD-10-CM

## 2015-03-11 DIAGNOSIS — F840 Autistic disorder: Secondary | ICD-10-CM | POA: Diagnosis not present

## 2015-03-11 MED ORDER — DIAZEPAM 5 MG PO TABS: 5.0000 mg | ORAL_TABLET | Freq: Four times a day (QID) | ORAL | Status: DC | PRN

## 2015-03-11 MED ORDER — RISPERIDONE 1 MG/ML PO SOLN: ORAL | Status: DC

## 2015-03-11 MED ORDER — SERTRALINE HCL 20 MG/ML PO CONC: ORAL | Status: DC

## 2015-03-11 MED ORDER — LAMOTRIGINE 25 MG PO TABS: ORAL_TABLET | ORAL | Status: DC

## 2015-03-11 MED ORDER — DIAZEPAM 5 MG PO TABS: ORAL_TABLET | ORAL | Status: DC

## 2015-03-11 MED ORDER — HYDROXYZINE HCL 10 MG/5ML PO SYRP: ORAL_SOLUTION | ORAL | Status: DC

## 2015-03-11 NOTE — Progress Notes (Signed)
Patient: Antonio Silva MRN: [id number] Sex: male DOB: 06-15-1996  Provider: Deetta Perla, MD Location of Care: [facility]  Note type: Routine return visit  History of Present Illness: Referral Source: Bradley Ferris History from: mother and CAP worker, referring office and [facility] chart Chief Complaint: Autism Spectrum Disorder/ Epilepsy/ Anxiety  Antonio Silva is a 19 y.o. male who was evaluated March 11, 2015, for the first time since July 30, 2014.  He has autism spectrum disorder with impairment of language and intellectual disability.  He is dependent on others for his care, though he is independent for number of self-help skills.  He has a history of well-controlled generalized tonic-clonic seizures.  He has problems with agitation particularly when there is a change in his usually structured activities.  He also has shown increasing anxiety, which thought perhaps to be related to his gastric upset.  His last seizure was in mid-July, 2015.    His mother's major concern today is that the patient continues to push on the side of his eyes, although he did not do that at all during the assessment today.  His mother is convinced that lamotrigine is responsible for his symptoms despite the fact that it is an extremely low dose, that he shows no problems with pupillary response, with his ability to fix and follow on lighted objects, and there are no abnormalities that we can see either in fundoscopy or superficial examination of the retina.  His major medical problem is anxiety.  He also has problems with gastric upset treated with Prevacid.   Review of Systems: 12 system review was unremarkable except as noted above  Past Medical History Diagnosis Date  . Seizures   . Autistic disorder, current or active state   . GERD (gastroesophageal reflux disease)   . Chronic constipation   . Anxiety    Hospitalizations: Yes.  , Head Injury: No., Nervous System  Infections: No., Immunizations up to date: Yes.    GI issues are treated at [facility].   Birth History 6 lbs. 11 oz. infant born at full-term to a 71 year old primigravida.  Mother gained 30 pounds  Labor lasted for 10 hours  Normal spontaneous vaginal delivery  Delayed developmental milestones particularly for language.  Behavior History restless, agitated, self-injurious  Surgical History History reviewed. No pertinent past surgical history.  Family History family history includes Asthma in his mother; Breast cancer in his paternal grandmother; Depression in his mother; Diabetes in his mother; Heart attack in his maternal grandfather and maternal grandmother; Hyperlipidemia in his mother; Hypertension in his mother; Ulcerative colitis in his father. Family history is negative for migraines, seizures, intellectual disabilities, blindness, deafness, birth defects, chromosomal disorder, or autism.  Social History . Marital Status: Single    Spouse Name: N/A  . Number of Children: N/A  . Years of Education: N/A   Social History Main Topics  . Smoking status: Never Smoker   . Smokeless tobacco: Never Used  . Alcohol Use: No  . Drug Use: No  . Sexual Activity: No   Social History Narrative   Educational level special education School Attending: [employer]  Occupation: Student    Living with both parents   Hobbies/Interest: Antonio Silva enjoys daily living skills, walking, hiking, and swinging.  School comments: Antonio Silva has good days and bad days.  Allergies Allergen Reactions  . Other     Lactose   Physical Exam Ht 5\' 7"  (1.702 m)  Wt 172 lb (78.019 kg)  BMI 26.93 kg/m2  General: alert, well developed, well nourished, in no acute distress, brown hair, blue eyes, right handed Head: normocephalic, no dysmorphic features Respiratory: auscultation clear Cardiovascular: no murmurs, pulses are normal Musculoskeletal: no skeletal deformities or apparent  scoliosis Skin: no rashes or neurocutaneous lesions  Neurologic Exam  Mental Status: alert; Antonio Silva was wary of me that was generally activities. He only sat down once. He tended to pace the floor. He tried to push my hands away as I examined him up for the most part allowed me to assess him. Cranial Nerves: visual fields are full to double simultaneous stimuli; extraocular movements are full and conjugate; pupils are round reactive to light; symmetric facial strength; midline tongue and uvula; he will localize sound bilaterally Motor: Normal functional strength, tone and mass; good fine motor movements Coordination: No tremor or dysmetria Gait and Station: Slightly broad-based but stable gait and station Reflexes: symmetric and normal bilaterally in his arms  Assessment 1. Autism spectrum disorder with accompanying intellectual impairment requiring very substantial support (level 3). 2. Mixed receptive-expressive language disorder, F80.2. 3. Generalized convulsive epilepsy, G40.309. 4. Insomnia, G47.00. 5. Anxiety state, F41.1.  Discussion The patient's autistic behavior is unchanged.  In my opinion, he has not injured his eyes due to pushing on them.  I do not know a way to modify his behavior.  I am pleased that his seizures are under good control.  I am not comfortable placing him on medicines for anxiety at some point that may be necessary.  Plan Prescriptions were written for diazepam, hydroxyzine, lamotrigine, risperidone, and sertraline.  He will return to see me in six months' time.  I will see him sooner based on clinical need.  I spent 30 minutes of face-to-face time with Antonio Silva and his mother and a CAPS aide, more than half of it in consultation.   Medication List   This list is accurate as of: 03/11/15 11:59 PM.       diazepam 5 MG tablet  Commonly known as:  VALIUM  Take 1 tablet by mouth every 6 hours as needed for anxiety     hydrOXYzine 10 MG/5ML syrup  Commonly  known as:  ATARAX  TAKE 25 MLS BY MOUTH EVERY NIGHT AT BEDTIME     lamoTRIgine 25 MG tablet  Commonly known as:  LAMICTAL  TAKE 3 TABLETS BY MOUTH TWICE A DAY.     lansoprazole 30 MG disintegrating tablet  Commonly known as:  PREVACID SOLUTAB  Take 1 tablet (30 mg total) by mouth daily.     PREVACID SOLUTAB 30 MG disintegrating tablet  Generic drug:  lansoprazole  TAKE 1 TABLET BY MOUTH DAILY     polyethylene glycol powder powder  Commonly known as:  GLYCOLAX/MIRALAX  TAKE 17GM BY MOUTH TWICE DAILY AS NEEDED     risperiDONE 1 MG/ML oral solution  Commonly known as:  RISPERDAL  TAKE 1.5MLS EVERY MORNING AT 7AM AND AT 5PM     sertraline 20 MG/ML concentrated solution  Commonly known as:  ZOLOFT  GIVE DAILY      The medication list was reviewed and reconciled. All changes or newly prescribed medications were explained.  A complete medication list was provided to the patient/caregiver.  Deetta Perla MD

## 2015-04-10 ENCOUNTER — Telehealth: Payer: Self-pay | Admitting: *Deleted

## 2015-04-10 NOTE — Telephone Encounter (Signed)
I wrote the letter and let Mom know that it was ready to be picked up. TG

## 2015-04-10 NOTE — Telephone Encounter (Signed)
Marl's mother, Lupita Leash, called and states that due to state Medicaid cuts Julies is at risk of loosing about 30 hours of care a week. Mom states that she would like to fight this but will need letters from his physicians verifying level of disability for [place]. She would like Dr. Sharene Skeans to write out a letter for this cause if possible. If there are any questions please call her at:  Cb#: [phone]

## 2015-04-13 ENCOUNTER — Other Ambulatory Visit: Payer: Self-pay | Admitting: *Deleted

## 2015-04-13 MED ORDER — LANSOPRAZOLE 30 MG PO TBDP: 30.0000 mg | ORAL_TABLET | Freq: Every day | ORAL | Status: DC

## 2015-04-20 NOTE — Telephone Encounter (Signed)
Thank you Antonio Silva! 

## 2015-04-20 NOTE — Telephone Encounter (Signed)
Left message on Antonio Silva's voicemail letting her know that I put the letter in the mail today.

## 2015-04-20 NOTE — Telephone Encounter (Signed)
Mom called and states that she has been unable to pick up the letter for [facility]. She would like it mailed to their address on file if possible.   I will look for letter up front and mail this to her and advise mom when done.

## 2015-05-05 ENCOUNTER — Telehealth: Payer: Self-pay | Admitting: *Deleted

## 2015-05-05 DIAGNOSIS — G40309 Generalized idiopathic epilepsy and epileptic syndromes, not intractable, without status epilepticus: Secondary | ICD-10-CM

## 2015-05-05 DIAGNOSIS — Z79899 Other long term (current) drug therapy: Secondary | ICD-10-CM

## 2015-05-05 DIAGNOSIS — E669 Obesity, unspecified: Secondary | ICD-10-CM

## 2015-05-05 NOTE — Telephone Encounter (Signed)
Called mother, Antonio Silva, patient's father answered because mother is still working. I let him know that Dr. Sharene SkeansHickling ordered a CBC with differential and Lamotrigine levels. Dad asked why Dr. Sharene SkeansHickling did not order for his lipids, glucose, and hgb to be checked as he had in previous occasions. I let dad know that Dr. Sharene SkeansHickling ordered what he thought was needed from the patient on his end. Dad stated that they needed the test that were ordered from our office on previous occasion ordered because Antonio Silva will be going under general anesthesia and as our office knows this only happens on rare occasion and they like to take advantage of this to get everything checked. I let him know that in order for us to order "everything" as they would like I would need more details as to what test they are requesting and why. He stated that he would have patient's mother call me back tomorrow with further details.

## 2015-05-05 NOTE — Telephone Encounter (Signed)
Called mom and left voicemail letting her know that there is no laboratory needed at this time and invited her to call me back with further questions or concerns.

## 2015-05-05 NOTE — Telephone Encounter (Signed)
I reviewed the chart and in my opinion no laboratory work is needed.  Please inform mother.

## 2015-05-05 NOTE — Telephone Encounter (Signed)
Antonio MarshallJimmy, patient's father, called and states he would like a call back regarding patient's blood work because he is confused.

## 2015-05-05 NOTE — Telephone Encounter (Signed)
Patient's mother called and states that Antonio Silva is scheduled for dental work at [facility] on 11/10 and any lab work needs to be in two weeks before he goes in for his appointment. If any lab work is needed from Dr. Sharene SkeansHickling please fax to [phone].

## 2015-05-05 NOTE — Addendum Note (Signed)
Addended by: Deetta PerlaHICKLING, WILLIAM H on: 05/05/2015 03:37 PM   Modules accepted: Orders

## 2015-05-05 NOTE — Telephone Encounter (Signed)
On your desk

## 2015-05-05 NOTE — Telephone Encounter (Signed)
Antonio Silva, patient's father, back and he states that his wife told him to call because she was left under the impression last time she spoke with Dr. Sharene SkeansHickling that Antonio Silva needed blood work. He states that it is going on a couple years since he has had it and they would prefer to go ahead and get it at this upcoming visit. They are requesting everything that is usually checked [facility].   CB#: [phone] Antonio Silva

## 2015-05-06 NOTE — Addendum Note (Signed)
Addended by: Deetta PerlaHICKLING, WILLIAM H on: 05/06/2015 09:31 AM   Modules accepted: Orders

## 2015-05-06 NOTE — Telephone Encounter (Signed)
Called and spoke to patient's mother this morning. Antonio Silva states that Antonio Silva has already done Pre-Op for his dental procedure this past Monday. She states that the dental procedure in which he will be under general anesthesia and when they are also trying to draw blood work will be done [facility] 10[facility]2016 [facility] with Dr. Berdine DanceJudith Silva. She states the reason they are requesting a complete blood panel is because they have a concern with Bran's A1C as he has been on the Risperdal for so long and they have been unable to check any blood work due to him being combative. Mom reports that Berton's GP, Antonio Silva left over the summer and the new one has not seen Antonio Silva at all and knows nothing about him. She states that besides his lamotrigine level, cbc, and hgb A1c, she would like all normal physical labs ordered such as complete metabolic panel, lipids, and such so that this opportunity can be taken advantage of and Antonio Silva is not sedated again for blood work soon.  CB: [phone]

## 2015-05-06 NOTE — Telephone Encounter (Signed)
Please fax to the number given to you

## 2015-05-06 NOTE — Telephone Encounter (Addendum)
Orders faxed. Left a voicemail letting mother know that lab orders were faxed.

## 2015-05-13 ENCOUNTER — Telehealth: Payer: Self-pay

## 2015-05-13 NOTE — Telephone Encounter (Signed)
[name] lvm double checking on lab orders, wanting to know which ones were ordered. I lvm for mother letting her know that Dr. Rexene EdisonH would not be returning to the office until next week. I listed the labs that were ordered (from 05-06-15).

## 2015-05-14 ENCOUNTER — Telehealth: Payer: Self-pay | Admitting: Family Medicine

## 2015-05-14 MED ORDER — OMEPRAZOLE 20 MG PO CPDR: 20.0000 mg | DELAYED_RELEASE_CAPSULE | Freq: Every day | ORAL | Status: DC

## 2015-05-14 NOTE — Telephone Encounter (Signed)
Father is calling to see if there is another acid reflux medication that is better than Prevacid. It seems that this isn't helping anymore. They would like someone stronger but would be able to be crushed so they can mix this is in drink. Please call father to discuss. jw

## 2015-05-14 NOTE — Telephone Encounter (Signed)
Discussed with mother that Prevacid seems to not be working as patient is grabbing chest during day  Mother explained that they are disintegrating prevacid in water.  Explained that per pharm this may not be delivering meds to patient well.  Will start omeprazole 20mg  daily with capsule poured into apple sauce or apple juice.  Mother to let me know if this is working.  Erasmo DownerAngela M Marra Fraga, MD, MPH PGY-2,  [facility] 05/14/2015 11:47 AM

## 2015-05-18 ENCOUNTER — Ambulatory Visit (INDEPENDENT_AMBULATORY_CARE_PROVIDER_SITE_OTHER): Payer: Medicaid Other | Admitting: *Deleted

## 2015-05-18 DIAGNOSIS — Z23 Encounter for immunization: Secondary | ICD-10-CM

## 2015-05-25 ENCOUNTER — Telehealth: Payer: Self-pay | Admitting: Family Medicine

## 2015-05-25 NOTE — Telephone Encounter (Signed)
Father called and would like to speak to Dr. Beryle FlockBacigalupo about his son. He has some questions. jw

## 2015-05-25 NOTE — Telephone Encounter (Signed)
Attempted to call family back. No answer.  Left VM asking them to call back to clinic.  Then got ahold of father.    Having trouble getting omeprazole capsule beads into patient.  Woke up with chest pain and coughing.  Vomitted once last week.  Has gas pains too.  Always been a problem for him  Simethicone doesn't work.  Think prevacid might have gone in better.  Again explained that prevacid dissolved in water is ineffective.  Try omeprazole in applesauce, can increase to BID if necessary.  Erasmo DownerAngela M Bacigalupo, MD, MPH PGY-2,  [facility] 05/25/2015 3:25 PM

## 2015-06-10 ENCOUNTER — Telehealth: Payer: Self-pay | Admitting: *Deleted

## 2015-06-10 NOTE — Telephone Encounter (Signed)
Called and spoke to patient's father. He states that he is unaware of what laboratory was used to result these labs. He states that he will [facility] to check if they were done internally and request they be faxed to us or he will call me back and leave me a voicemail once he knows where they were done so I can request the results. He states that they are in a big rush to have these and it would possibly be Monday before he called me back.  CB: [phone]

## 2015-06-10 NOTE — Telephone Encounter (Signed)
I looked in the result section and the studies have not been resulted in please find out where the laboratory studies were done and call that laboratory and see if they will release the results.  You may need to call Dad to find out where he was. Please report back to me this afternoon.

## 2015-06-10 NOTE — Telephone Encounter (Signed)
I attempted to call Dr. Selena LesserMessura's office [phone]) multiple times but I was unable to process the call. It would not go through. I will try again at a later time.

## 2015-06-10 NOTE — Telephone Encounter (Signed)
Mom called me back and left me a new number to call for patient's results at [facility].   [facility]: [phone]

## 2015-06-10 NOTE — Telephone Encounter (Signed)
Called [facility] and they will be faxing over results this afternoon. I will put them in your basket when they are received.

## 2015-06-10 NOTE — Telephone Encounter (Signed)
Patient's father called and left a voicemail stating that Antonio Silva got lab work performed during his dental procedure and they have not heard anything back from our clinic regarding results of these.   CB: [phone]

## 2015-06-10 NOTE — Telephone Encounter (Signed)
I spoke with them for 4 minutes.  I found only has slightly elevated fasting glucose at 110 hemoglobin A1c was 5.9.  He has a prediabetic state.  His triglycerides are slightly elevated at 166, hDL low normal at 36,LDL was 102.  There is no reason to consider anticholesterol medication.  I will scan results into the chart.

## 2015-06-15 NOTE — Telephone Encounter (Signed)
Dad Gwinda PasseJimmy Kunda left a message asking for a copy of the lab results that Dr Sharene SkeansHickling discussed with parents last week to be mailed to him. I printed the report and mailed it as requested. TG

## 2015-07-13 ENCOUNTER — Other Ambulatory Visit: Payer: Self-pay | Admitting: Family Medicine

## 2015-07-14 ENCOUNTER — Telehealth: Payer: Self-pay | Admitting: *Deleted

## 2015-07-14 NOTE — Telephone Encounter (Signed)
Prior Authorization received from [facility] for Pre[facility]. Formulary and PA form placed in provider box for completion. Clovis PuMartin, Gavinn Collard L, RN

## 2015-07-14 NOTE — Telephone Encounter (Signed)
Sent to wrong physician! Please advise refill?

## 2015-07-15 NOTE — Telephone Encounter (Signed)
Per Dr Penelope CoopBacigalupo's November note :"Having trouble getting omeprazole capsule beads into patient. Woke up with chest pain and coughing. Vomitted once last week. Has gas pains too. Always been a problem for him  Simethicone doesn't work. Think prevacid might have gone in better. Again explained that prevacid dissolved in water is ineffective. Try omeprazole in applesauce, can increase to BID if necessary."    There does not appear to be an office visit or telephone call since that time regarding Omeprazole use.  Can you call patient's parents to clarify how and if they have been using Omeprazole as directed in November?

## 2015-07-16 NOTE — Telephone Encounter (Signed)
LMOVM for pts mom to return call. Farooq Petrovich, [name]

## 2015-07-17 NOTE — Telephone Encounter (Signed)
Spoke with dad.  They have tried the omeprazole in applesauce and various other ways, and they can get the pt to take the med.  He said a lot of the issue is that does not eat very much, so it is hard to get even the applesauce in him.  Advised that I would send message to MD, but the PA could take a few weeks.  He is ok with this and is grateful for any help. Fleeger, Maryjo RochesterJessica Dawn

## 2015-07-23 ENCOUNTER — Encounter: Payer: Self-pay | Admitting: *Deleted

## 2015-07-23 NOTE — Telephone Encounter (Signed)
This encounter was created in error - please disregard.

## 2015-07-23 NOTE — Telephone Encounter (Addendum)
Prior authorization for Prevacid 30 mg completed via [place] [employer].  Mother provided additional info--patient is autistic and unable to swallow capsules; med has to be mixed with applesauce.  Patient is nonverbal and often gets agitated when he is having abdominal pain.  [place] [employer] informed and med approved for 07/24/15 - 07/23/16.  Prior approval # [id number].  [facility] [address] [facility].  Altamese Dilling~Kendyn Zaman, BSN, RN-BC

## 2015-07-30 ENCOUNTER — Telehealth: Payer: Self-pay | Admitting: *Deleted

## 2015-07-30 NOTE — Telephone Encounter (Signed)
[facility] for prevacid, however, this med was approved via [place] tracks on 07/23/2015. Maria at [facility] notified of approval number [id number]) and dates (07/24/15-07/23/16). Fredderick SeveranceUCATTE, Krisalyn Yankowski L, RN

## 2015-07-30 NOTE — Telephone Encounter (Addendum)
Maria from [facility] called back stating that recent prior authorization approval on prevacid was for capsules but patient needs solutabs. [place] [employer] [phone]) notified. Assistant states that new review by pharmacist will take up to 24 hours. New PA # [id number] Following information provided from previous notes: Mother provided additional info--patient is autistic and unable to swallow capsules; med has to be mixed with applesauce. Patient is nonverbal and often gets agitated when he is having abdominal pain. Also noted that patient has tried and failed omeprazole and simethicone per previous note. Fredderick SeveranceUCATTE, LAURENZE L, RN

## 2015-07-31 ENCOUNTER — Other Ambulatory Visit: Payer: Self-pay | Admitting: Pediatrics

## 2015-07-31 NOTE — Telephone Encounter (Signed)
Received PA approval for Prevacid 30 mg Solutab via [place] [employer].   [facility] informed[facility]  PA approval number [id number]. Clovis PuMartin, Tamika L, RN

## 2015-09-10 ENCOUNTER — Ambulatory Visit: Payer: Medicaid Other | Admitting: Pediatrics

## 2015-09-12 ENCOUNTER — Other Ambulatory Visit: Payer: Self-pay | Admitting: Pediatrics

## 2015-09-14 ENCOUNTER — Telehealth: Payer: Self-pay | Admitting: Family Medicine

## 2015-09-14 NOTE — Telephone Encounter (Signed)
Father called because his is giving his son both acid reflux medications since just one is not helping. He was told this by the GI doctor, but he would like to speak to Dr. Beryle Flock about this. jw

## 2015-09-16 NOTE — Telephone Encounter (Signed)
Spoke with mother who reports that GI recently added prevacid in addition to prilosec for GERD.  Just wanted [place] to be aware of medication changes.   Erasmo Downer, MD, MPH PGY-2,  [facility] 09/16/2015 2:08 PM

## 2015-09-17 ENCOUNTER — Encounter: Payer: Self-pay | Admitting: Pediatrics

## 2015-09-17 ENCOUNTER — Ambulatory Visit (INDEPENDENT_AMBULATORY_CARE_PROVIDER_SITE_OTHER): Payer: Medicaid Other | Admitting: Pediatrics

## 2015-09-17 VITALS — Ht 67.0 in | Wt 164.2 lb

## 2015-09-17 DIAGNOSIS — G40309 Generalized idiopathic epilepsy and epileptic syndromes, not intractable, without status epilepticus: Secondary | ICD-10-CM | POA: Diagnosis not present

## 2015-09-17 DIAGNOSIS — G4720 Circadian rhythm sleep disorder, unspecified type: Secondary | ICD-10-CM | POA: Diagnosis not present

## 2015-09-17 DIAGNOSIS — F840 Autistic disorder: Secondary | ICD-10-CM | POA: Diagnosis not present

## 2015-09-17 DIAGNOSIS — F802 Mixed receptive-expressive language disorder: Secondary | ICD-10-CM

## 2015-09-17 DIAGNOSIS — F419 Anxiety disorder, unspecified: Secondary | ICD-10-CM | POA: Diagnosis not present

## 2015-09-17 NOTE — Progress Notes (Signed)
Patient: Antonio Silva MRN: [id number] Sex: male DOB: 18-May-1996  Provider: Deetta Perla, MD Location of Care: [facility]  Note type: Routine return visit  History of Present Illness: Referral Source: Antonio Silva History from: mother, patient and [facility] chart Chief Complaint: Autism Spectrum Disorder/Epilepsy/Anxiety  Antonio Silva is a 20 y.o. male who returns on September 17, 2015 for the first time since March 11, 2015.  Antonio Silva has autism spectrum disorder with limited language and intellectual disability.  He has had a history of seizures that fortunately have been well controlled.  His last seizure was July 2015.  His sleep is variable.  Anxiety may be part of what keeps him awake.  His CAPS funding has been dropped.  He now receives about 54 hours per week.  His time at [facility] has dropped from 30 to 14 hours.  I do not think that this has affected him very much.  His general health has been good.  He has dropped about eight pounds for reasons that I do not understand.  He was here today with his mother and Merchandiser, retail.  He did not have other concerns.  Review of Systems: 12 system review was remarkable for see history of the present illness, past medical history, behavioral history  Past Medical History Diagnosis Date  . Seizures (HCC)   . Autistic disorder, current or active state   . GERD (gastroesophageal reflux disease)   . Chronic constipation   . Anxiety    Hospitalizations: No., Head Injury: No., Nervous System Infections: No., Immunizations up to date: Yes.    GI issues are treated at [facility].   Birth History 6 lbs. 11 oz. infant born at full-term to a 74 year old primigravida.  Mother gained 30 pounds  Labor lasted for 10 hours  Normal spontaneous vaginal delivery  Delayed developmental milestones particularly for language.  Behavior History autism spectrum disorder, restless, agitated, self-injurious  Surgical  History History reviewed. No pertinent past surgical history.  Family History family history includes Asthma in his mother; Breast cancer in his paternal grandmother; Depression in his mother; Diabetes in his mother; Heart attack in his maternal grandfather and maternal grandmother; Hyperlipidemia in his mother; Hypertension in his mother; Ulcerative colitis in his father. Family history is negative for migraines, seizures, intellectual disabilities, blindness, deafness, birth defects, chromosomal disorder, or autism.  Social History . Marital Status: Single    Spouse Name: N/A  . Number of Children: N/A  . Years of Education: N/A   Social History Main Topics  . Smoking status: Never Smoker   . Smokeless tobacco: Never Used  . Alcohol Use: No  . Drug Use: No  . Sexual Activity: No   Social History Narrative    Antonio Silva is 20 yo male that attended [employer]. He is in a day program and is doing well. He lives with both parents and has no siblings. He enjoys attending his day program and walking   Allergies Allergen Reactions  . Other     Lactose   Physical Exam BP   Pulse   Ht  (1.702 m)  Wt 164 lb 3.2 oz (74.481 kg)  BMI 25.71 kg/m2  General: alert, well developed, well nourished, in no acute distress, brown hair, blue eyes, right handed Head: normocephalic, no dysmorphic features Respiratory: auscultation clear Cardiovascular: no murmurs, pulses are normal Musculoskeletal: no skeletal deformities or apparent scoliosis Skin: no rashes or neurocutaneous lesions  Neurologic Exam  Mental Status: alert;  Antonio Silva was wary of me that was generally activities. He only sat down once. He tended to pace the floor. He tried to push my hands away as I examined him up for the most part allowed me to assess him. Cranial Nerves: visual fields are full to double simultaneous stimuli; extraocular movements are full and conjugate; pupils are round reactive to light; symmetric  facial strength; midline tongue and uvula; he will localize sound bilaterally Motor: Normal functional strength, tone and mass; good fine motor movements Coordination: No tremor or dysmetria Gait and Station: Slightly broad-based but stable gait and station Reflexes: symmetric and normal bilaterally in his arms  Assessment 1. Autism spectrum disorder with accompanying intellectual impairment, requiring very substantial support (level 3), F84.0. 2. Generalized convulsive epilepsy, G40.309. 3. Mixed receptive-expressive language disorder, F80.2. 4. 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 5. Anxiety, F91.9.  Discussion I do not know what else to do for him.  I think that he is receiving a very good care through the CAPS program.  I do not know what I can do to reinstate his hours.  Plan I will see him in six months' time.  I spent 15 minutes of face-to-face time with Antonio Silva and his mother and CAPS worker, more than half of it in consultation.   Medication List   This list is accurate as of: 09/17/15 11:20 AM.       diazepam 5 MG tablet  Commonly known as:  VALIUM  Take 1 tablet by mouth every 6 hours as needed for anxiety     hydrOXYzine 10 MG/5ML syrup  Commonly known as:  ATARAX  TAKE 25 MLS BY MOUTH EVERY NIGHT AT BEDTIME     lamoTRIgine 25 MG tablet  Commonly known as:  LAMICTAL  TAKE 3 TABLETS BY MOUTH TWICE A DAY.     omeprazole 20 MG capsule  Commonly known as:  PRILOSEC  Take 1 capsule (20 mg total) by mouth daily. Can open capsules and place in food or drink.     polyethylene glycol powder powder  Commonly known as:  GLYCOLAX/MIRALAX  DISSOLVE 17 GRAMS IN 8 OZ OF WATER AND DRINK BY MOUTH TWICE DAILY AS NEEDED     risperiDONE 1 MG/ML oral solution  Commonly known as:  RISPERDAL  TAKE 1.5MLS EVERY MORNING AT 7AM AND AT 5PM     sertraline 20 MG/ML concentrated solution  Commonly known as:  ZOLOFT  GIVE 1 ML DAILY      The medication list was reviewed and reconciled. All  changes or newly prescribed medications were explained.  A complete medication list was provided to the patient/caregiver.  Deetta Perla MD

## 2015-09-19 ENCOUNTER — Other Ambulatory Visit: Payer: Self-pay | Admitting: Pediatrics

## 2015-09-20 ENCOUNTER — Other Ambulatory Visit: Payer: Self-pay | Admitting: Pediatrics

## 2015-09-21 ENCOUNTER — Telehealth: Payer: Self-pay

## 2015-09-21 NOTE — Telephone Encounter (Signed)
Patient's mother called stating that there needs to be PA done on two of the patient's medications, Lamictal and Risperdal. Patient's mother states that she really needs the Lamictal done ASAP.  CB:[phone]

## 2015-09-21 NOTE — Telephone Encounter (Signed)
[name], mom, lvm stating that child's lamictal and risperidol are both requiring authorization. I called child's mother back to let her know that refill authorization was sent to the pharmacy.

## 2015-09-21 NOTE — Telephone Encounter (Signed)
I sent in the refills for the Rx's at 7:50AM and did the PA at 9:00AM today. I just called the pharmacy and was told that both meds are ready to be picked up. Please let Mom know. Thanks, Inetta Fermoina

## 2015-10-14 ENCOUNTER — Other Ambulatory Visit: Payer: Self-pay | Admitting: Family

## 2015-10-15 ENCOUNTER — Other Ambulatory Visit: Payer: Self-pay | Admitting: *Deleted

## 2015-10-15 MED ORDER — LANSOPRAZOLE 30 MG PO TBDP: 30.0000 mg | ORAL_TABLET | Freq: Every day | ORAL | Status: DC

## 2015-10-17 ENCOUNTER — Other Ambulatory Visit: Payer: Self-pay | Admitting: Pediatrics

## 2015-11-02 ENCOUNTER — Telehealth: Payer: Self-pay

## 2015-11-02 DIAGNOSIS — G40309 Generalized idiopathic epilepsy and epileptic syndromes, not intractable, without status epilepticus: Secondary | ICD-10-CM

## 2015-11-02 MED ORDER — LAMOTRIGINE 25 MG PO TABS: ORAL_TABLET | ORAL | Status: DC

## 2015-11-02 NOTE — Telephone Encounter (Signed)
James, dad, lvm stating that patient had a seizure around 1:30 pm yesterday, 11-01-15. James increased patient's Lamictal from 3 tabs to 4 tabs last night and this morning. CB# [phone].

## 2015-11-02 NOTE — Telephone Encounter (Signed)
I misunderstood the note, he has been giving 4 tablets twice daily since last night. We will increase his dose and resubmit his prescription.

## 2015-11-14 ENCOUNTER — Other Ambulatory Visit: Payer: Self-pay | Admitting: Family Medicine

## 2015-11-27 ENCOUNTER — Other Ambulatory Visit: Payer: Self-pay | Admitting: Family

## 2016-03-13 ENCOUNTER — Other Ambulatory Visit: Payer: Self-pay | Admitting: Family Medicine

## 2016-03-23 ENCOUNTER — Other Ambulatory Visit: Payer: Self-pay | Admitting: Pediatrics

## 2016-03-31 ENCOUNTER — Other Ambulatory Visit: Payer: Self-pay | Admitting: Family

## 2016-04-26 ENCOUNTER — Telehealth (INDEPENDENT_AMBULATORY_CARE_PROVIDER_SITE_OTHER): Payer: Self-pay | Admitting: Pediatrics

## 2016-04-26 ENCOUNTER — Other Ambulatory Visit: Payer: Self-pay | Admitting: Family

## 2016-04-26 NOTE — Telephone Encounter (Signed)
-----   Message from Tina Goodpasture, NP sent at 04/26/2016  9:46 AM EDT ----- °Regarding: Needs appointment °Ronel needs an appointment with Dr Hickling.  °Thanks,  °Tina °

## 2016-04-26 NOTE — Telephone Encounter (Signed)
Mother state they will be out ot town.  Call her back week of Oct 16 to make fu appt

## 2016-04-27 ENCOUNTER — Other Ambulatory Visit: Payer: Self-pay | Admitting: Pediatrics

## 2016-04-27 DIAGNOSIS — G40309 Generalized idiopathic epilepsy and epileptic syndromes, not intractable, without status epilepticus: Secondary | ICD-10-CM

## 2016-05-13 ENCOUNTER — Other Ambulatory Visit: Payer: Self-pay | Admitting: *Deleted

## 2016-05-13 MED ORDER — LANSOPRAZOLE 30 MG PO TBDP: 30.0000 mg | ORAL_TABLET | Freq: Every day | ORAL | 1 refills | Status: DC

## 2016-05-19 ENCOUNTER — Ambulatory Visit (INDEPENDENT_AMBULATORY_CARE_PROVIDER_SITE_OTHER): Payer: Medicaid Other | Admitting: *Deleted

## 2016-05-19 DIAGNOSIS — Z23 Encounter for immunization: Secondary | ICD-10-CM | POA: Diagnosis present

## 2016-05-19 NOTE — Progress Notes (Signed)
   Patient presents with Habilitation Case Worker, Adelfa KohJanetha Farrar for flu vaccine. Spoke with patient's father via telephoe and received permission to give patient flu vaccine. Verified allergies with father.  Flu vaccine given with assistance from case worker and J. Hartsell, CMA  L. Leward Quanucatte, RN, BSN

## 2016-05-22 ENCOUNTER — Other Ambulatory Visit: Payer: Self-pay | Admitting: Family

## 2016-05-29 ENCOUNTER — Other Ambulatory Visit: Payer: Self-pay | Admitting: Family

## 2016-05-29 DIAGNOSIS — G40309 Generalized idiopathic epilepsy and epileptic syndromes, not intractable, without status epilepticus: Secondary | ICD-10-CM

## 2016-05-31 ENCOUNTER — Ambulatory Visit (HOSPITAL_COMMUNITY)
Admission: EM | Admit: 2016-05-31 | Discharge: 2016-05-31 | Disposition: A | Discharge status: Nursing Home (Includes ICF) | Payer: Medicaid Other | Attending: Family Medicine | Admitting: Family Medicine

## 2016-05-31 ENCOUNTER — Encounter (HOSPITAL_COMMUNITY): Payer: Self-pay | Admitting: Family Medicine

## 2016-05-31 ENCOUNTER — Emergency Department (HOSPITAL_COMMUNITY): Payer: Medicaid Other

## 2016-05-31 ENCOUNTER — Encounter (HOSPITAL_COMMUNITY): Payer: Self-pay | Admitting: Radiology

## 2016-05-31 ENCOUNTER — Emergency Department (HOSPITAL_COMMUNITY)
Admission: EM | Admit: 2016-05-31 | Discharge: 2016-05-31 | Disposition: A | Discharge status: Home / Self Care | Payer: Medicaid Other | Attending: Emergency Medicine | Admitting: Emergency Medicine

## 2016-05-31 DIAGNOSIS — R109 Unspecified abdominal pain: Secondary | ICD-10-CM

## 2016-05-31 DIAGNOSIS — K5909 Other constipation: Secondary | ICD-10-CM | POA: Insufficient documentation

## 2016-05-31 DIAGNOSIS — F840 Autistic disorder: Secondary | ICD-10-CM | POA: Insufficient documentation

## 2016-05-31 LAB — CBC WITH DIFFERENTIAL/PLATELET
BASOS ABS: 0 10*3/uL (ref 0.0–0.1)
BASOS PCT: 0 %
EOS PCT: 0 %
Eosinophils Absolute: 0 10*3/uL (ref 0.0–0.7)
HCT: 40.1 % (ref 39.0–52.0)
Hemoglobin: 14 g/dL (ref 13.0–17.0)
LYMPHS PCT: 13 %
Lymphs Abs: 1.5 10*3/uL (ref 0.7–4.0)
MCH: 32 pg (ref 26.0–34.0)
MCHC: 34.9 g/dL (ref 30.0–36.0)
MCV: 91.8 fL (ref 78.0–100.0)
Monocytes Absolute: 0.6 10*3/uL (ref 0.1–1.0)
Monocytes Relative: 5 %
Neutro Abs: 9.1 10*3/uL — ABNORMAL HIGH (ref 1.7–7.7)
Neutrophils Relative %: 82 %
PLATELETS: 182 10*3/uL (ref 150–400)
RBC: 4.37 MIL/uL (ref 4.22–5.81)
RDW: 12.3 % (ref 11.5–15.5)
WBC: 11.2 10*3/uL — AB (ref 4.0–10.5)

## 2016-05-31 LAB — COMPREHENSIVE METABOLIC PANEL
ALBUMIN: 4.1 g/dL (ref 3.5–5.0)
ALT: 58 U/L (ref 17–63)
AST: 55 U/L — AB (ref 15–41)
Alkaline Phosphatase: 103 U/L (ref 38–126)
Anion gap: 10 (ref 5–15)
BUN: 7 mg/dL (ref 6–20)
CHLORIDE: 105 mmol/L (ref 101–111)
CO2: 23 mmol/L (ref 22–32)
CREATININE: 1.04 mg/dL (ref 0.61–1.24)
Calcium: 9.8 mg/dL (ref 8.9–10.3)
GFR calc Af Amer: >60 mL/min (ref >60)
GFR calc non Af Amer: >60 mL/min (ref >60)
GLUCOSE: 137 mg/dL — AB (ref 65–99)
Potassium: 4.5 mmol/L (ref 3.5–5.1)
SODIUM: 138 mmol/L (ref 135–145)
Total Bilirubin: 0.9 mg/dL (ref 0.3–1.2)
Total Protein: 6.8 g/dL (ref 6.5–8.1)

## 2016-05-31 LAB — LIPASE, BLOOD: Lipase: 22 U/L (ref 11–51)

## 2016-05-31 MED ORDER — DIAZEPAM 5 MG/ML IJ SOLN
5.0000 mg | Freq: Once | INTRAMUSCULAR | Status: AC
  Administered 2016-05-31: 5 mg via INTRAVENOUS

## 2016-05-31 MED ORDER — DIPHENHYDRAMINE HCL 50 MG/ML IJ SOLN
25.0000 mg | Freq: Once | INTRAMUSCULAR | Status: AC
  Administered 2016-05-31: 25 mg via INTRAMUSCULAR
  Filled 2016-05-31: qty 1

## 2016-05-31 MED ORDER — LEVOFLOXACIN 25 MG/ML PO SOLN: 500.0000 mg | Freq: Every day | ORAL | 0 refills | Status: AC

## 2016-05-31 MED ORDER — HALOPERIDOL LACTATE 5 MG/ML IJ SOLN
5.0000 mg | Freq: Once | INTRAMUSCULAR | Status: AC
  Administered 2016-05-31: 5 mg via INTRAMUSCULAR
  Filled 2016-05-31: qty 1

## 2016-05-31 MED ORDER — DIAZEPAM 5 MG/ML IJ SOLN
5.0000 mg | Freq: Once | INTRAMUSCULAR | Status: AC
  Administered 2016-05-31: 5 mg via INTRAMUSCULAR
  Filled 2016-05-31: qty 2

## 2016-05-31 MED ORDER — MIDAZOLAM HCL 2 MG/2ML IJ SOLN
2.0000 mg | Freq: Once | INTRAMUSCULAR | Status: AC
  Administered 2016-05-31: 2 mg via INTRAMUSCULAR
  Filled 2016-05-31: qty 2

## 2016-05-31 MED ORDER — IOPAMIDOL (ISOVUE-300) INJECTION 61%
INTRAVENOUS | Status: AC
  Administered 2016-05-31: 100 mL
  Filled 2016-05-31: qty 100

## 2016-05-31 NOTE — Discharge Instructions (Signed)
Go to ED via shuttle for higher level of care.

## 2016-05-31 NOTE — ED Notes (Signed)
CT ready for the pt. Transported pt with tech to CT scan. Pt not in 4 point restraints for scan. Security and [employer] in CT area assisting me with the pt.

## 2016-05-31 NOTE — ED Provider Notes (Signed)
CSN: [id number]     Arrival date & time 05/31/16  1002 History   First MD Initiated Contact with Patient 05/31/16 1021     Chief Complaint  Patient presents with  . Abdominal Pain   (Consider location/radiation/quality/duration/timing/severity/associated sxs/prior Treatment) Patient presents with c/o severe abdominal pain.  Patient has hx of Severe Autism.  He has hx of GERD and GI motility disorder and chronic constipation.  He has been retching and having Severe abdominal pain.  He will not allow anyone to examine him.  He was up last night dry heaving and grabbing his chest and abdomen.  His family/care givers are with him and state this is a chronic problem that is worsened.   The history is provided by a relative. The history is limited by a language barrier.  Abdominal Pain  Pain quality: aching   Pain radiates to:  Chest Pain severity:  Severe Onset quality:  Sudden Duration:  2 days Timing:  Intermittent Progression:  Worsening Chronicity:  Recurrent Context: recent illness and retching   Relieved by:  Nothing Worsened by:  Vomiting and bowel movements Ineffective treatments:  None tried Associated symptoms: anorexia, chest pain, constipation, diarrhea, fatigue and vomiting     Past Medical History:  Diagnosis Date  . Anxiety   . Autistic disorder, current or active state   . Chronic constipation   . GERD (gastroesophageal reflux disease)   . Seizures (HCC)    History reviewed. No pertinent surgical history. Family History  Problem Relation Age of Onset  . Heart attack Maternal Grandmother     Died at 8261  . Heart attack Maternal Grandfather     Died at 1570  . Breast cancer Paternal Grandmother     Died at 1570  . Asthma Mother   . Diabetes Mother   . Depression Mother   . Hypertension Mother   . Hyperlipidemia Mother   . Ulcerative colitis Father    Social History  Substance Use Topics  . Smoking status: Never Smoker  . Smokeless tobacco: Never Used  .  Alcohol use No    Review of Systems  Constitutional: Positive for fatigue.  HENT: Negative.   Eyes: Negative.   Respiratory: Negative.   Cardiovascular: Positive for chest pain.  Gastrointestinal: Positive for abdominal pain, anorexia, constipation, diarrhea and vomiting.  Endocrine: Negative.   Genitourinary: Negative.   Musculoskeletal: Negative.   Skin: Negative.   Allergic/Immunologic: Negative.   Neurological: Negative.   Hematological: Negative.   Psychiatric/Behavioral: Negative.     Allergies  Other  Home Medications   Prior to Admission medications   Medication Sig Start Date End Date Taking? Authorizing Provider  diazepam (VALIUM) 5 MG tablet TAKE 1 TABLET EVERY 6 HOURS AS NEEDED FOR ANXIETY 10/19/15   Elveria Risingina Goodpasture, NP  hydrOXYzine (ATARAX) 10 MG/5ML syrup TAKE 25 MLS BY MOUTH EVERY NIGHT AT BEDTIME 10/14/15   Elveria Risingina Goodpasture, NP  lamoTRIgine (LAMICTAL) 25 MG tablet TAKE 4 TABLETS TWICE DAILY 05/30/16   Elveria Risingina Goodpasture, NP  lansoprazole (PREVACID SOLUTAB) 30 MG disintegrating tablet Take 1 tablet (30 mg total) by mouth daily. 05/13/16   Erasmo DownerAngela M Bacigalupo, MD  omeprazole (PRILOSEC) 20 MG capsule TAKE 1 CAPSULE (20 MG TOTAL) BY MOUTH DAILY. CAN OPEN CAPSULES AND PLACE IN FOOD OR DRINK. 03/14/16   Casey BurkittHillary Moen Fitzgerald, MD  polyethylene glycol powder (GLYCOLAX/MIRALAX) powder DISSOLVE 17 GRAMS IN 8 OZ OF WATER AND DRINK BY MOUTH TWICE DAILY AS NEEDED 07/14/15   Erasmo DownerAngela M Bacigalupo, MD  risperiDONE (RISPERDAL) 1 MG/ML oral solution TAKE 1.5MLS EVERY MORNING AT 7AM AND AT 5PM 05/23/16   Elveria Risingina Goodpasture, NP  sertraline (ZOLOFT) 20 MG/ML concentrated solution GIVE 1 ML DAILY 03/23/16   Elveria Risingina Goodpasture, NP   Meds Ordered and Administered this Visit  Medications - No data to display  There were no vitals taken for this visit. No data found.   Physical Exam  Constitutional:  Patient is autistic and becomes agitated when exam attempted.  Nursing note and vitals  reviewed.   Urgent Care Course   Clinical Course     Procedures (including critical care time)  Labs Review Labs Reviewed - No data to display  Imaging Review No results found.   Visual Acuity Review  Right Eye Distance:   Left Eye Distance:   Bilateral Distance:    Right Eye Near:   Left Eye Near:    Bilateral Near:         MDM   1. Acute abdominal pain    Unable to examine or assess patient.  Based upon hx given by famiy and care givers he has to be sedated in order to be thoroughly examined.  I discussed with family and care giver that he should be evaluated in the ED and needs higher level of care for his acute abdominal pain.  Family agree. Patient is to be DC'd here and will be seen in the ED.    Deatra CanterWilliam J Donnell Wion, FNP 05/31/16 [phone]

## 2016-05-31 NOTE — ED Triage Notes (Signed)
d 

## 2016-05-31 NOTE — ED Notes (Signed)
Incomplete VS as pt did not tolerate

## 2016-05-31 NOTE — ED Triage Notes (Signed)
Per mom, pt has been having abd pain over a year and worsening over the past 6 months. Has been seen by multiple GI doctors and taking miralax and PPIs with no relief. Pt is severely autistic per mom. sts his stomach is tight and has been waking up gagging and not eating.

## 2016-05-31 NOTE — ED Notes (Signed)
Security and [employer] at bedside keeping pt calm. Parents and caregiver at bedside as well.

## 2016-05-31 NOTE — ED Triage Notes (Signed)
Family with patient that has complaints of abd pain over the year and has increased over the last 6 months. Pt is severely autistic per the mother and nonverbal. Pt is non-cooperative at this time.

## 2016-05-31 NOTE — ED Notes (Signed)
EDP at bedside  

## 2016-06-01 ENCOUNTER — Other Ambulatory Visit: Payer: Self-pay | Admitting: Family

## 2016-06-01 ENCOUNTER — Telehealth: Payer: Self-pay | Admitting: Surgery

## 2016-06-01 ENCOUNTER — Telehealth (INDEPENDENT_AMBULATORY_CARE_PROVIDER_SITE_OTHER): Payer: Self-pay

## 2016-06-01 ENCOUNTER — Telehealth: Payer: Self-pay | Admitting: Family Medicine

## 2016-06-01 DIAGNOSIS — G40309 Generalized idiopathic epilepsy and epileptic syndromes, not intractable, without status epilepticus: Secondary | ICD-10-CM

## 2016-06-01 MED ORDER — LAMOTRIGINE 25 MG PO TBDP: ORAL_TABLET | ORAL | 1 refills | Status: DC

## 2016-06-01 NOTE — Telephone Encounter (Signed)
I called [facility] and was told that Mom requested the dispersible tablets because she is having trouble getting [name] to take the regular tablets. I authorized the lamotrigine dispersible tablets. TG

## 2016-06-01 NOTE — Telephone Encounter (Signed)
ED [facility] on [address] regarding Prior Auth for levaquin  Solution. Patient is autistic and does not swallow pills. CM contacted [employer] for Prior authorization. OA#[id number] given rep states, it may take up to 24 hours for the PA to show up. CM updated patient's mom and [facility] no further ED CM needs identified.

## 2016-06-01 NOTE — Telephone Encounter (Signed)
Call patient's father back to clarify whether they are looking for a surgery or GI referral. This is unclear from the note and the notes from the emergency department give very little information.  Please clarify when father calls back.  Erasmo DownerAngela M Joslyne Marshburn, MD, MPH PGY-3,  [facility] 06/01/2016 2:48 PM

## 2016-06-01 NOTE — Telephone Encounter (Signed)
Pt was in ED yesterday for impaction. Dad states pt needs to have it surgically removed and ED also recommended an endoscopy. PT is severely autistic  and [facility] to know [facility]. Please advise. Thanks! ep

## 2016-06-01 NOTE — Telephone Encounter (Signed)
[facility] on [address] lvm stating that pt is requesting lamotrigine dispersible tablets. They are asking for an Rx to be e-scribed or faxed to them. [phone]

## 2016-06-02 ENCOUNTER — Telehealth: Payer: Self-pay | Admitting: Surgery

## 2016-06-03 ENCOUNTER — Telehealth: Payer: Self-pay | Admitting: *Deleted

## 2016-06-03 ENCOUNTER — Telehealth: Payer: Self-pay | Admitting: Family Medicine

## 2016-06-03 DIAGNOSIS — K5649 Other impaction of intestine: Secondary | ICD-10-CM | POA: Insufficient documentation

## 2016-06-03 NOTE — Telephone Encounter (Signed)
Referral placed for Gen surg.  Sounds like possible SBO but no documentation, exam, or imaging to be sure.  Erasmo DownerAngela M Bacigalupo, MD, MPH PGY-3,  [facility] 06/03/2016 1:35 PM

## 2016-06-03 NOTE — Telephone Encounter (Signed)
Father called.  They have decided to [facility].  He feels this needs to be done ASAP because son is not eating or drinking.  Please advise

## 2016-06-03 NOTE — Telephone Encounter (Addendum)
I am precepting today and was asked to review this referral.  I called and spoke with patient's mother  Stanton KidneyDonna Crossman to clarify whether he is taking PO - as if he is not eating or drinking then an outpatient referral is not appropriate and he will need inpatient admission.  Per patient's mother, he is eating and drinking some today. He is urinating. Leaking some stool around the impaction.  She reports being told that he would need a surgical procedure to disimpact. Per mother, due to his severe autism and adult size, this would likely need to be done in the operating room under general anesthesia. She is calling to arrange a surgical date.  On review of chart, there is no ED provider note written yet, so difficult to tell what the thought process was or whether surgery was recommended. It appears patient was instructed to follow up with [facility]. Mother reports they talked with Dr. Matthias HughsBuccini via phone and he did say that patient needs a surgical procedure to be arranged.  I explained to her that it is not possible to schedule a surgical procedure without him first being evaluated by a surgeon. She became quite frustrated with me on the phone, asking why this was not arranged from the emergency room. She does not think a surgical outpatient visit will be helpful, as due to his behavioral and psychiatric issues he is likely not going to be able to be examined in the outpatient setting. She asked that I call ED case manager Burna MortimerWanda, who has apparently been working with them on [place]'s case.   I called the ED and spoke with a case manager, who said that Burna MortimerWanda will not be in until 4pm. Left my pager # with that case manager so that Burna MortimerWanda can call me as soon as she gets in this afternoon.  I called mom back, and she did not answer multiple times, but eventually I was able to reach her. Updated her that Burna MortimerWanda would be calling me back.   Reviewed with mom that our options for surgical evaluation include 1.  Outpatient surgery referral (earliest we could schedule him would be Monday or Tuesday) - not safe if he is not eating or drinking, or is worsening acutely. She thinks he is some better today. Is leaking stool around the fecal impaction, but is taking PO. Mom states she thinks he will be okay through the weekend. 2. ED visit for surgical consultation - mom wants to avoid the ED since they were just there 3. Direct admission for surgical consultation - potentially a good option as we can minimize wait times in ER and try to avoid behavioral complications of his autism  Mom apologized for getting frustrated on the phone earlier, she has just had a difficult time dealing with these issues. She could not talk on the phone for long. Advised I will regroup with her after I speak with Burna MortimerWanda after 4pm. She advised I could call her back, but that she may not be able to answer the phone.   I advised that if we are going to arrange direct admission to the hospital it will need to be today due to it being Friday. I am here until 5pm. Mom says "we cannot do it today" due to being busy.  I stressed to her that if anything changes at all this weekend, if he is not able to eat or drink, is not urinating, has a fever, has worsened pain, or any other new  symptoms that he will have to be seen in the ED for surgical consultation this weekend. Mom stated her understanding and appreciation of these recommendations.  I will regroup with mom after hearing from [place]anda after 4pm.  Latrelle DodrillBrittany J Sharmarke Cicio, MD

## 2016-06-03 NOTE — Telephone Encounter (Signed)
Updates: - spoke with ER case manager Antonio Silva, who helped obtain medications for patient via prior auth. She does not have insight into the need for surgery - was just helping with medications.  - I also talked with Antonio Silva, who was very helpful.  [facility] of [facility] knows this family well - the dad has been a patient of his for a while. He has seen Antonio Silva once or twice before as a patient about 5 years ago. Patient is profoundly autistic & nonverbal.  The clinical issue is that patient has recurrent abdominal pain around times of defecation. Having some encopresis. Has been using chronic miralax at home, and usually waits until dad comes home to stool so that dad can help clean him up. Antonio Silva believes that the patient will require exam under general anesthesia, dilation of the anus, and manual extraction of the large stool burden. The hardest part of this case will likely be starting the IV, as patient is very combative with medical providers and requires restraints. He has history of [facility], and required lots of restraints. He will need escort into the procedure by a team and his caregiver (has some good rapport with his primary caregiver). May need premedication to help him calm down ahead of time.  Antonio Silva believes that outpatient referral to general surgery is best, so that patient can be seen in the office by general surgeon. He had suggested to patient's dad that [facility] (who likely has more experience dealing with neurocognitively challenged patients like this) but received a message today that the family was electing to have this done [place] in [place] instead.  I then called and spoke with mom again for the third time. She is agreeable to outpatient general surgery referral. We reviewed the need for ER visit this weekend if he clinically decompensates - she states her understanding of what symptoms should prompt ER visit (not  eating/drinking, vomiting, fever, not urinating, etc).  Tia, our referral coordinator, is out Monday morning. I will plan to call [facility] first thing Monday morning to try and get patient an appointment. Updated Tia. Routing as FYI to PCP.  Antonio DodrillBrittany J Sebastiano Luecke, MD

## 2016-06-03 NOTE — Telephone Encounter (Signed)
[facility] that [facility] mom says ED deemed needed. EDCM reviewed chart and could not find any evidence of pt needing surgery.
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
## 2016-06-03 NOTE — Telephone Encounter (Signed)
ED CM received call from patient's [name] regarding needing a referral from PCP for follow up with [facility]. Patient was seen on 05/31/16 in ED. [facility], CM offered to contact [facility] to follow up with the referral, she was agreeable. CM contacted clinic regarding follow-up with [facility] referral. No further ED CM needs identified.

## 2016-06-06 ENCOUNTER — Telehealth: Payer: Self-pay | Admitting: Family Medicine

## 2016-06-06 NOTE — Telephone Encounter (Signed)
Was able to schedule general surgery appointment at [facility]. They were able to book him in for an appointment tomorrow at 9am with Dr. Inetta FermoJean Ashburn, [facility] [facility] [address].  Called patient's mother and informed her of this news. She was happy to hear it. They are [facility] with [facility] and [facility], as that is where [name] goes for his dental work.  Latrelle DodrillBrittany J Dell Hurtubise, MD

## 2016-06-06 NOTE — Telephone Encounter (Signed)
[facility]. The two colorectal surgeons at [facility], Dr. Michaell CowingGross and Dr. Maisie Fushomas, have declined to see this patient in the office at all.   I called patient's mother and explained to her the other options are: 1. ED visit for inpatient surgical consultation (this cannot be declined by consultants) 2. Direct admit for surgical consultation 3. Attempt to schedule visit at [facility] office to see if they can help.  [facility] would [facility] possible. Will attempt to schedule this & get back with mom.  Latrelle DodrillBrittany J McIntyre, MD

## 2016-06-06 NOTE — Telephone Encounter (Signed)
Mother and father called because there seems to be some confusion on their part about the appointment for their son tomorrow. Please call and go over the information again. jw

## 2016-06-06 NOTE — Telephone Encounter (Signed)
Called mom & advised - she had question about whether the appointment tomorrow was to have the procedure done or if it was just to meet the surgeon. Advised it was likely just to meet the surgeon, a procedure would have to be scheduled separately.  Latrelle DodrillBrittany J Jaimen Melone, MD

## 2016-06-06 NOTE — Telephone Encounter (Signed)
Received a note that there was a call from Treyon's dad Chanetta MarshallJimmy [phone]). I tried to call him back but the phone rang with no voicemail set up.  I have not yet heard back from [facility] - I just called a bit ago and no one answered there (they may be on lunch break). Will try again in a bit.  Latrelle DodrillBrittany J McIntyre, MD

## 2016-06-06 NOTE — Telephone Encounter (Signed)
Update - called [facility]. The triage nurse is working on finding an appointment for him.  She has my pager number and will let me know when she works something out.  Antonio DodrillBrittany J Tikesha Mort, MD

## 2016-06-08 ENCOUNTER — Ambulatory Visit (INDEPENDENT_AMBULATORY_CARE_PROVIDER_SITE_OTHER): Payer: Medicaid Other | Admitting: Pediatrics

## 2016-06-12 ENCOUNTER — Other Ambulatory Visit: Payer: Self-pay | Admitting: Family

## 2016-06-27 ENCOUNTER — Other Ambulatory Visit: Payer: Self-pay | Admitting: Family

## 2016-07-01 ENCOUNTER — Other Ambulatory Visit: Payer: Self-pay | Admitting: Family Medicine

## 2016-07-06 ENCOUNTER — Telehealth (INDEPENDENT_AMBULATORY_CARE_PROVIDER_SITE_OTHER): Payer: Self-pay | Admitting: Pediatrics

## 2016-07-06 NOTE — Telephone Encounter (Signed)
Appointment scheduled for July 13, 2016 at 11:30

## 2016-07-06 NOTE — Telephone Encounter (Signed)
-----   Message from Elveria Risingina Goodpasture, NP sent at 04/26/2016  9:46 AM EDT ----- Regarding: Needs appointment [name] needs an appointment with Dr Sharene SkeansHickling.  Thanks,  Inetta Fermoina

## 2016-07-13 ENCOUNTER — Encounter (INDEPENDENT_AMBULATORY_CARE_PROVIDER_SITE_OTHER): Payer: Self-pay | Admitting: Pediatrics

## 2016-07-13 ENCOUNTER — Ambulatory Visit (INDEPENDENT_AMBULATORY_CARE_PROVIDER_SITE_OTHER): Payer: Medicaid Other | Admitting: Pediatrics

## 2016-07-13 VITALS — Ht 67.0 in | Wt 160.4 lb

## 2016-07-13 DIAGNOSIS — G4720 Circadian rhythm sleep disorder, unspecified type: Secondary | ICD-10-CM | POA: Diagnosis not present

## 2016-07-13 DIAGNOSIS — F802 Mixed receptive-expressive language disorder: Secondary | ICD-10-CM

## 2016-07-13 DIAGNOSIS — F840 Autistic disorder: Secondary | ICD-10-CM | POA: Diagnosis not present

## 2016-07-13 DIAGNOSIS — G40309 Generalized idiopathic epilepsy and epileptic syndromes, not intractable, without status epilepticus: Secondary | ICD-10-CM

## 2016-07-13 MED ORDER — DIAZEPAM 5 MG PO TABS: 5.0000 mg | ORAL_TABLET | Freq: Four times a day (QID) | ORAL | 5 refills | Status: DC | PRN

## 2016-07-13 MED ORDER — RISPERIDONE 1 MG/ML PO SOLN: ORAL | 5 refills | Status: DC

## 2016-07-13 MED ORDER — LAMOTRIGINE 25 MG PO TBDP: ORAL_TABLET | ORAL | 5 refills | Status: DC

## 2016-07-13 MED ORDER — HYDROXYZINE HCL 10 MG/5ML PO SYRP: ORAL_SOLUTION | ORAL | 5 refills | Status: DC

## 2016-07-13 MED ORDER — SERTRALINE HCL 20 MG/ML PO CONC: ORAL | 5 refills | Status: DC

## 2016-07-13 NOTE — Patient Instructions (Signed)
Please complete signing up for My Chart.

## 2016-07-13 NOTE — Progress Notes (Signed)
Patient: Antonio Silva MRN: [id number] Sex: male DOB: Dec 04, 1995  Provider: Ellison CarwinWilliam Briyonna Omara, MD [facility]  Note type: Routine return visit  History of Present Illness: Referral Source: Dr. Everlene OtherJayce Cook, DO History from: both parents and aide, [facility] and [facility] chart Chief Complaint: Autism Spectrum Disorder/Anxiety/Epilepsy  Antonio Silva is a 20 y.o. male who returns on July 13, 2016 for the first time since September 17, 2015.  He has autism spectrum disorder with limited language and intellectual disability.  He has a history of generalized convulsive epilepsy that is well controlled with antiepileptic medicines.  His last seizure was July, 2015.  He has problems with falling asleep.  On his last visit, there was great concern that his [employer] funding had been cut.  This threatened participation in his day program.  Mother was able to successfully appeal to get all the hours back.  He has a caregiver and [facility] about [facility] a [facility].  He had surgical disimpaction for obstipation and afterwards had no appetite.  He was not drinking.  He had to go back to the [facility] for IV hydration.  He [place] to look for the efficacy of various medications.  It was found that the medicines he is taking are predicted to be fairly effective.  Trazodone has been given to him in the evening, which is also helping him fall asleep as well as diminishing his anxiety and improving his behavior.  He was somewhat frustrated and disquieted today, but overall because I examined what I could noninvasively, he tolerated handling fairly well and was cooperative.  There were no emotional outbursts.  Review of Systems: 12 system review was remarkable for medication medication; the remainder was assessed and was negative  Past Medical History Diagnosis Date  . Anxiety   . Autistic disorder, current or active state   .  Chronic constipation   . GERD (gastroesophageal reflux disease)   . Seizures (HCC)    Hospitalizations: Yes.  , Head Injury: No., Nervous System Infections: No., Immunizations up to date: Yes.    [facility].   Birth History 6 lbs. 11 oz. infant born at full-term to a 21 year old primigravida.  Mother gained 30 pounds  Labor lasted for 10 hours  Normal spontaneous vaginal delivery  Delayed developmental milestones particularly for language.  Behavior History autism spectrum disorder, restless, agitated, self-injurious  Surgical History History reviewed. No pertinent surgical history.  Family History family history includes Asthma in his mother; Breast cancer in his paternal grandmother; Depression in his mother; Diabetes in his mother; Heart attack in his maternal grandfather and maternal grandmother; Hyperlipidemia in his mother; Hypertension in his mother; Ulcerative colitis in his father. Family history is negative for migraines, seizures, intellectual disabilities, blindness, deafness, birth defects, chromosomal disorder, or autism.  Social History . Marital status: Single    Spouse name: N/A  . Number of children: N/A  . Years of education: N/A   Social History Main Topics  . Smoking status: Never Smoker  . Smokeless tobacco: Never Used  . Alcohol use No  . Drug use: No  . Sexual activity: No   Social History Narrative    Antonio Silva is 20 yo male that attended [employer].     He is in a day program and is doing well.     He lives with both parents and has no siblings.  He enjoys attending his day program and walking   Allergies Allergen Reactions  . Ketamine Other (See Comments)    CAUSES SEIZURES   . Other     Lactose   Physical Exam Ht 5\' 7"  (1.702 m)   Wt 160 lb 6.4 oz (72.8 kg)   BMI 25.12 kg/m   General: alert, well developed, well nourished, in no acute distress, brown hair, blue eyes, right handed Head:  normocephalic, no dysmorphic features Ears, Nose and Throat: Otoscopic: tympanic membranes normal; pharynx: oropharynx is pink without exudates or tonsillar hypertrophy Neck: supple, full range of motion, no cranial or cervical bruits Respiratory: auscultation clear Cardiovascular: no murmurs, pulses are normal Musculoskeletal: no skeletal deformities or apparent scoliosis Skin: no rashes or neurocutaneous lesions  Neurologic Exam  Mental Status: alert; standing for most of the examination, but sat so that I could check his leg reflexes; followed some simple commands; wary; was quiet and did not have any verbal eruptions Cranial Nerves: visual fields are full to double simultaneous stimuli; extraocular movements are full and conjugate; pupils are round reactive to light; funduscopic examination shows bilateral positive red reflex; impassive symmetric facial strength; midline tongue and uvula; turns to localize sound bilaterally Motor: normal functional strength, tone and mass; good fine motor movements Sensory: intact responses to noxious stimuli Coordination: no tremor or obvious incoordination Gait and Station: slightly broad based stiff gait and station; balance is adequate Reflexes: symmetric and diminished bilaterally; no clonus; bilateral flexor plantar responses  Assessment 1. Autism spectrum disorder with accompanying intellectual impairment, requiring very substantial support (level 3), F84.0. 2. 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 3. Mixed receptive-expressive language disorder, F80.2. 4. 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  Discussion Antonio Silva is stable neurologically.  I am pleased that he is not having seizures and that he does not seem as irritable as I experienced another visits.  I am fairly certain that he is sleeping well.  He has lost a little weight since I saw him last time and looks well.  I am pleased that he has the support during the day to that he needs in  order to be properly supervised.  Plan I had no other recommendations today.  I refilled prescriptions for lamotrigine, diazepam, hydroxyzine, risperidone, and trazodone.  He will return to see me in six months' time.  I spent 30 minutes of face-to-face time with Antonio Silva and his parents and caregiver.   Medication List   Accurate as of 07/13/16 11:34 AM.      BIOGAIA PROBIOTIC Liqd Take 5 mLs by mouth daily at 8 pm.   diazepam 5 MG tablet Commonly known as:  VALIUM TAKE 1 TABLET BY MOUTH EVERY 6 HOURS AS NEEDED FOR ANXIETY   hydrOXYzine 10 MG/5ML syrup Commonly known as:  ATARAX TAKE 25 MLS BY MOUTH EVERY NIGHT AT BEDTIME   lamotrigine 25 MG disintegrating tablet Commonly known as:  LAMICTAL ODT Take 4 tablets twice daily   lansoprazole 30 MG disintegrating tablet Commonly known as:  PREVACID SOLUTAB Take 1 tablet (30 mg total) by mouth daily.   multivitamin Liqd Take 5 mLs by mouth daily.   omeprazole 20 MG capsule Commonly known as:  PRILOSEC TAKE 1 CAPSULE (20 MG TOTAL) BY MOUTH DAILY. CAN OPEN CAPSULES AND PLACE IN FOOD OR DRINK.   polyethylene glycol powder powder Commonly known as:  GLYCOLAX/MIRALAX DISSOLVE 17 GRAMS IN 8 OZ OF WATER AND DRINK BY MOUTH TWICE DAILY AS NEEDED   risperiDONE 1 MG/ML oral solution Commonly known  as:  RISPERDAL TAKE 1.5MLS EVERY MORNING AT 7AM AND AT 5PM   sertraline 20 MG/ML concentrated solution Commonly known as:  ZOLOFT GIVE 1 ML BY MOUTH DAILY   traZODone 50 MG tablet Commonly known as:  DESYREL Take 75 mg by mouth daily.   traZODone 50 MG tablet Commonly known as:  DESYREL Take 75 mg by mouth.     The medication list was reviewed and reconciled. All changes or newly prescribed medications were explained.  A complete medication list was provided to the patient/caregiver.  Deetta PerlaWilliam H Zafira Munos MD

## 2016-07-15 ENCOUNTER — Encounter (INDEPENDENT_AMBULATORY_CARE_PROVIDER_SITE_OTHER): Payer: Self-pay | Admitting: Pediatrics

## 2016-07-18 HISTORY — PX: FECAL DISIMPACTION: SHX1579

## 2016-07-19 ENCOUNTER — Other Ambulatory Visit: Payer: Self-pay | Admitting: Internal Medicine

## 2016-07-23 ENCOUNTER — Other Ambulatory Visit: Payer: Self-pay | Admitting: Family

## 2016-08-05 ENCOUNTER — Telehealth (INDEPENDENT_AMBULATORY_CARE_PROVIDER_SITE_OTHER): Payer: Self-pay | Admitting: Pediatrics

## 2016-08-05 NOTE — Telephone Encounter (Signed)
I reached mother and let her know that it seen the chromosomal evaluation in Dr. Letta MoynahanKurt Kleinpeter's note but did not have the details.  She thinks that she saw my My Chart note.  There is nothing else to do.

## 2016-09-07 ENCOUNTER — Encounter: Payer: Self-pay | Admitting: Family Medicine

## 2016-09-08 MED ORDER — OMEPRAZOLE 20 MG PO CPDR: 20.0000 mg | DELAYED_RELEASE_CAPSULE | Freq: Two times a day (BID) | ORAL | 11 refills | Status: DC

## 2016-09-08 MED ORDER — POLYETHYLENE GLYCOL 3350 17 GM/SCOOP PO POWD: 17.0000 g | Freq: Three times a day (TID) | ORAL | 11 refills | Status: DC

## 2016-09-14 ENCOUNTER — Emergency Department (HOSPITAL_COMMUNITY)
Admission: EM | Admit: 2016-09-14 | Discharge: 2016-09-14 | Disposition: A | Discharge status: Home / Self Care | Payer: Medicaid Other | Attending: Emergency Medicine | Admitting: Emergency Medicine

## 2016-09-14 ENCOUNTER — Encounter (HOSPITAL_COMMUNITY): Payer: Self-pay | Admitting: *Deleted

## 2016-09-14 DIAGNOSIS — Z79899 Other long term (current) drug therapy: Secondary | ICD-10-CM | POA: Insufficient documentation

## 2016-09-14 DIAGNOSIS — Y999 Unspecified external cause status: Secondary | ICD-10-CM | POA: Insufficient documentation

## 2016-09-14 DIAGNOSIS — S9031XA Contusion of right foot, initial encounter: Secondary | ICD-10-CM | POA: Diagnosis not present

## 2016-09-14 DIAGNOSIS — X509XXA Other and unspecified overexertion or strenuous movements or postures, initial encounter: Secondary | ICD-10-CM | POA: Insufficient documentation

## 2016-09-14 DIAGNOSIS — S99921A Unspecified injury of right foot, initial encounter: Secondary | ICD-10-CM | POA: Diagnosis present

## 2016-09-14 DIAGNOSIS — F840 Autistic disorder: Secondary | ICD-10-CM | POA: Diagnosis not present

## 2016-09-14 DIAGNOSIS — Y9339 Activity, other involving climbing, rappelling and jumping off: Secondary | ICD-10-CM | POA: Diagnosis not present

## 2016-09-14 DIAGNOSIS — Y929 Unspecified place or not applicable: Secondary | ICD-10-CM | POA: Diagnosis not present

## 2016-09-14 NOTE — ED Notes (Signed)
Pt's family member stated pt is autistic and would not wear armband. Pt's family has armband on hand with her.

## 2016-09-14 NOTE — Discharge Instructions (Signed)
Please read and follow all provided instructions.  Your diagnoses today include:  1. Contusion of right foot, initial encounter     Tests performed today include:  Vital signs. See below for your results today.   Medications prescribed:   Ibuprofen (Motrin, Advil) - anti-inflammatory pain medication  Do not exceed 600mg  ibuprofen every 6 hours, take with food  You have been prescribed an anti-inflammatory medication or NSAID. Take with food. Take smallest effective dose for the shortest duration needed for your pain. Stop taking if you experience stomach pain or vomiting.   Take any prescribed medications only as directed.  Home care instructions:   Follow any educational materials contained in this packet  Follow R.I.C.E. Protocol (as best as you can):  R - rest your injury   I  - use ice on injury without applying directly to skin  C - compress injury with bandage or splint  E - elevate the injury as much as possible  Follow-up instructions: Please follow-up with your primary care provider or the provided orthopedic physician (bone specialist) if you continue to have significant pain in 1 week. In this case you may have a more severe injury that requires further care.   Return instructions:   Please return if your toes or feet are numb or tingling, appear gray or blue, or you have severe pain (also elevate the leg and loosen splint or wrap if you were given one)  Please return to the Emergency Department if you experience worsening symptoms.   Please return if you have any other emergent concerns.  Additional Information:  Your vital signs today were: BP 160/80 (BP Location: Right Arm)    Resp 16    Ht 5\' 7"  (1.702 m)    Wt 72.6 kg    BMI 25.06 kg/m  If your blood pressure (BP) was elevated above 135/85 this visit, please have this repeated by your doctor within one month. --------------

## 2016-09-14 NOTE — ED Provider Notes (Signed)
[facility] DEPT Provider Note   CSN: [id number] Arrival date & time: 09/14/16  4098     History   Chief Complaint Chief Complaint  Patient presents with  . Foot Pain    HPI Antonio Silva is a 21 y.o. male.  Patient with history of autism brought in by parents with complaint of bruising to the right foot. Patient was jumping up and down 4 days ago and seemed to injure the foot. They're unsure as to the exact mechanism of injury and the patient is nonverbal. They noted some swelling and bruising over the top of the foot and along the side of the foot. He has been walking with a slight limp. No treatments prior to arrival. They were concerned about fracture. Onset of symptoms acute. Course is constant. Nothing makes symptoms better.      Past Medical History:  Diagnosis Date  . Anxiety   . Autistic disorder, current or active state   . Chronic constipation   . GERD (gastroesophageal reflux disease)   . Seizures [facility])     Patient Active Problem List   Diagnosis Date Noted  . Impaction of colon (HCC) 06/03/2016  . Mixed receptive-expressive language disorder 03/11/2015  . 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 Otitis externa 01/22/2014  . 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 Anxiety 12/31/2013  . GERD (gastroesophageal reflux disease) 12/31/2013  . Chronic constipation 12/31/2013  . 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 Encounter for long-term (current) use of other medications 01/07/2013  . Autistic disorder, current or active state 01/07/2013    History reviewed. No pertinent surgical history.     Home Medications    Prior to Admission medications   Medication Sig Start Date End Date Taking? Authorizing Provider  BIOGAIA PROBIOTIC (BIOGAIA PROBIOTIC) LIQD Take 5 mLs by mouth daily at 8 pm.    Historical Provider, MD  diazepam (VALIUM) 5 MG tablet  Take 1 tablet (5 mg total) by mouth every 6 (six) hours as needed. for anxiety 07/13/16   Deetta Perla, MD  hydrOXYzine (ATARAX) 10 MG/5ML syrup TAKE 25 MLS BY MOUTH EVERY NIGHT AT BEDTIME 07/13/16   Deetta Perla, MD  lamotrigine (LAMICTAL ODT) 25 MG disintegrating tablet Take 4 tablets twice daily 07/13/16   Deetta Perla, MD  lansoprazole (PREVACID SOLUTAB) 30 MG disintegrating tablet Take 1 tablet (30 mg total) by mouth daily. 05/13/16   Erasmo Downer, MD  Multiple Vitamin (MULTIVITAMIN) LIQD Take 5 mLs by mouth daily.    Historical Provider, MD  omeprazole (PRILOSEC) 20 MG capsule Take 1 capsule (20 mg total) by mouth 2 (two) times daily before a meal. CAN OPEN CAPSULES AND PLACE IN FOOD OR DRINK. 09/08/16   Erasmo Downer, MD  polyethylene glycol powder (GLYCOLAX/MIRALAX) powder Take 17 g by mouth 3 (three) times daily. 09/08/16   Erasmo Downer, MD  risperiDONE (RISPERDAL) 1 MG/ML oral solution TAKE 1.5MLS EVERY MORNING AT 7AM AND AT 5PM 07/13/16   Deetta Perla, MD  sertraline (ZOLOFT) 20 MG/ML concentrated solution Take 0.5 mL in the morning 07/13/16   Deetta Perla, MD  traZODone (DESYREL) 50 MG tablet Take 75 mg by mouth daily. 05/18/16   Historical Provider, MD    Family History Family History  Problem Relation Age of Onset  . Heart attack Maternal Grandmother     Died at 36  . Heart attack Maternal Grandfather  Died at 5370  . Breast cancer Paternal Grandmother     Died at 5370  . Asthma Mother   . Diabetes Mother   . Depression Mother   . Hypertension Mother   . Hyperlipidemia Mother   . Ulcerative colitis Father     Social History Social History  Substance Use Topics  . Smoking status: Never Smoker  . Smokeless tobacco: Never Used  . Alcohol use No     Allergies   Ketamine and Other   Review of Systems Review of Systems  Unable to perform ROS: Patient nonverbal     Physical Exam Updated Vital Signs BP 160/80 (BP  Location: Right Arm)   Resp 16   Ht 5\' 7"  (1.702 m)   Wt 72.6 kg   BMI 25.06 kg/m   Physical Exam  Constitutional: He appears well-developed and well-nourished.  Musculoskeletal: Normal range of motion. He exhibits edema (Trace edema over forefoot.). He exhibits no tenderness.  There is slight ecchymosis over the base of the toes and along the lateral aspect of the foot. Father ranges the ankle and foot for me. Patient has full range of motion without pain.     ED Treatments / Results   Procedures Procedures (including critical care time)  Medications Ordered in ED Medications - No data to display   Initial Impression / Assessment and Plan / ED Course  I have reviewed the triage vital signs and the nursing notes.  Pertinent labs & imaging results that were available during my care of the patient were reviewed by me and considered in my medical decision making (see chart for details).     Patient seen and examined. Physical exam maneuvers performed by father due to patient discomfort and agitation. I watched the father manipulate the foot and examined the foot from several feet away, otherwise patient cannot cooperate.   Vital signs reviewed and are as follows: BP 160/80 (BP Location: Right Arm)   Resp 16   Ht 5\' 7"  (1.702 m)   Wt 72.6 kg   BMI 25.06 kg/m   I had a good discussion with the parents about the extent of this injury. Visually it appears to be a minor contusion or sprain. At most, it would be a small fracture, however patient is walking and moving normally on his feet. Certainly there are no major or displaced fractures given patient's use of the foot and since he has no guarding with exam and ranging. I would not anticipate any surgical need it patient did have a minor fracture. At this point, do not feel that the risks of sedation and x-ray outweigh any benefits that would be gained by doing such a test. I recommended that they treat the injury with conservative  measures and monitor closely. If they continue to notice any change in behaviors or ambulation in one week, they can follow-up with the primary care physician for recheck. They seem comfortable with this and are in agreement.    Final Clinical Impressions(s) / ED Diagnoses   Final diagnoses:  Contusion of right foot, initial encounter    New Prescriptions New Prescriptions   No medications on file     Renne CriglerJoshua Lokelani Lutes, PA-C 09/14/16 1121    Arby BarretteMarcy Pfeiffer, MD 09/22/16 [phone]

## 2016-09-14 NOTE — ED Triage Notes (Signed)
Per family, pt is autistic and was having behavioral issues on Saturday and was jumping up and down. Pt now has bruising to top of right foot. Pt has been ambulating on his foot but family wants xray due to bruising. Pt is noncompliant and may require sedation for any testing to be done.

## 2016-10-10 ENCOUNTER — Encounter: Payer: Self-pay | Admitting: Family Medicine

## 2016-10-21 ENCOUNTER — Encounter: Payer: Self-pay | Admitting: Family Medicine

## 2016-10-21 DIAGNOSIS — K5909 Other constipation: Secondary | ICD-10-CM

## 2016-10-21 DIAGNOSIS — K219 Gastro-esophageal reflux disease without esophagitis: Secondary | ICD-10-CM

## 2016-12-21 ENCOUNTER — Other Ambulatory Visit (INDEPENDENT_AMBULATORY_CARE_PROVIDER_SITE_OTHER): Payer: Self-pay | Admitting: Pediatrics

## 2016-12-21 NOTE — Telephone Encounter (Signed)
°  Who's calling (name and relationship to patient) : [name], mother Best contact number: [phone] Provider they see: Sharene SkeansHickling Reason for call: Mother stated she was informed by the pharmacy that Lamictal will no longer be made by the manufacturer. Needs to be switched to something else.       RESCRIPTION REFILL ONLY  Name of prescription:  Pharmacy:

## 2016-12-21 NOTE — Telephone Encounter (Signed)
Family has gone to another [facility] and apparently is been able to fill the prescription.

## 2017-01-10 ENCOUNTER — Other Ambulatory Visit (INDEPENDENT_AMBULATORY_CARE_PROVIDER_SITE_OTHER): Payer: Self-pay | Admitting: Pediatrics

## 2017-01-19 ENCOUNTER — Other Ambulatory Visit (INDEPENDENT_AMBULATORY_CARE_PROVIDER_SITE_OTHER): Payer: Self-pay | Admitting: Pediatrics

## 2017-01-31 ENCOUNTER — Other Ambulatory Visit (INDEPENDENT_AMBULATORY_CARE_PROVIDER_SITE_OTHER): Payer: Self-pay | Admitting: Pediatrics

## 2017-02-01 ENCOUNTER — Telehealth (INDEPENDENT_AMBULATORY_CARE_PROVIDER_SITE_OTHER): Payer: Self-pay | Admitting: Pediatrics

## 2017-02-01 NOTE — Telephone Encounter (Signed)
Patient needs appt for future refills. [phone]

## 2017-02-01 NOTE — Telephone Encounter (Signed)
L/M informing mom that it was time for Antonio Silva to come in for a follow up appointment. Informed her that we sent in the refill for his Lamotrigine rx but for further refills, he needed to be seen. Urged her to give us a call

## 2017-02-01 NOTE — Telephone Encounter (Signed)
Please call family to schedule [facility], I approved this months refill for his Lamotrgine.

## 2017-02-03 ENCOUNTER — Telehealth (INDEPENDENT_AMBULATORY_CARE_PROVIDER_SITE_OTHER): Payer: Self-pay

## 2017-02-03 MED ORDER — LAMOTRIGINE 25 MG PO CHEW: CHEWABLE_TABLET | ORAL | 0 refills | Status: DC

## 2017-02-03 NOTE — Telephone Encounter (Signed)
Rx has been sent in to pharmacy. 

## 2017-02-09 ENCOUNTER — Other Ambulatory Visit (INDEPENDENT_AMBULATORY_CARE_PROVIDER_SITE_OTHER): Payer: Self-pay | Admitting: Pediatrics

## 2017-02-13 ENCOUNTER — Encounter (INDEPENDENT_AMBULATORY_CARE_PROVIDER_SITE_OTHER): Payer: Self-pay | Admitting: Family

## 2017-02-13 ENCOUNTER — Ambulatory Visit (INDEPENDENT_AMBULATORY_CARE_PROVIDER_SITE_OTHER): Payer: Medicaid Other | Admitting: Family

## 2017-02-13 VITALS — Ht 67.0 in | Wt 161.6 lb

## 2017-02-13 DIAGNOSIS — G40309 Generalized idiopathic epilepsy and epileptic syndromes, not intractable, without status epilepticus: Secondary | ICD-10-CM

## 2017-02-13 DIAGNOSIS — F802 Mixed receptive-expressive language disorder: Secondary | ICD-10-CM

## 2017-02-13 DIAGNOSIS — F419 Anxiety disorder, unspecified: Secondary | ICD-10-CM

## 2017-02-13 DIAGNOSIS — G4720 Circadian rhythm sleep disorder, unspecified type: Secondary | ICD-10-CM | POA: Diagnosis not present

## 2017-02-13 DIAGNOSIS — F840 Autistic disorder: Secondary | ICD-10-CM | POA: Diagnosis not present

## 2017-02-13 NOTE — Progress Notes (Signed)
Patient: Antonio Silva MRN: [id number] Sex: male DOB: [birth year]  Provider: [name], NP Location of Care: [facility]  Note type: Routine return visit  History of Present Illness: Referral Source: Dr. Everlene Other, DO History from: mother, uncle and aide, patient and [facility] chart Chief Complaint: Autism spectrum disorder/anxiety/Epilepsy  Antonio Silva is a 21 y.o. young man with history of autism spectrum disorder with limited language, intellectual disability and insomnia.  He also has a history of generalized convulsive epilepsy that is well controlled with antiepileptic medicines.  His last seizure was July, 2015. He was last seen by Dr Sharene Skeans on July 13, 2016.   Antonio Silva is cared for at home by his family and by [employer] workers. He also attends a day program for about 30 hours per week. While he has been generally healthy, Antonio Silva has had ongoing problems with both reflux and constipation. Because the problems with constipation has been so severe, he has also required disimpaction at times. Unfortunately because of these problems, he is no longer toilet trained for bowel movements and has had to revert to wearing diapers and pull-ups. He will sometimes urinate in the toilet. His family and workers are working with him to help him re-learn toilet training.   Antonio Silva's behavior is still problematic at times. He has occasional outbursts of behavior, sometimes without obvious provocation. Today Antonio Silva is pacing in the exam room and occasionally goes to his worker, his mother or me, grasps a hand, pulls and says "go". When told no, he becomes frustrated, and paces more frantically, but can be soothed for the most part.   Neither his worker nor his mother have other health concerns for Antonio Silva today other than previously mentioned.  Review of Systems: Please see the HPI for neurologic and other pertinent review of systems. Otherwise, the following systems are  noncontributory including constitutional, eyes, ears, nose and throat, cardiovascular, respiratory, gastrointestinal, genitourinary, musculoskeletal, skin, endocrine, hematologic/lymph, allergic/immunologic and psychiatric.   Past Medical History:  Diagnosis Date  . Anxiety   . Autistic disorder, current or active state   . Chronic constipation   . GERD (gastroesophageal reflux disease)   . Seizures (HCC)    Hospitalizations: No., Head Injury: No., Nervous System Infections: No., Immunizations up to date: Yes.   Past Medical History Comments: See history  Birth History 6 lbs. 11 oz. infant born at full-term to a 11 year old primigravida.  Mother gained 30 pounds  Labor lasted for 10 hours  Normal spontaneous vaginal delivery  Delayed developmental milestones particularly for language.  Behavior History autism spectrum disorder, restless, agitated, self-injurious  Surgical History History reviewed. No pertinent surgical history.  Family History family history includes Asthma in his mother; Breast cancer in his paternal grandmother; Depression in his mother; Diabetes in his mother; Heart attack in his maternal grandfather and maternal grandmother; Hyperlipidemia in his mother; Hypertension in his mother; Ulcerative colitis in his father. Family History is otherwise negative for migraines, seizures, cognitive impairment, blindness, deafness, birth defects, chromosomal disorder, autism.  Social History Social History   Social History  . Marital status: Single    Spouse name: N/A  . Number of children: N/A  . Years of education: N/A   Social History Main Topics  . Smoking status: Never Smoker  . Smokeless tobacco: Never Used  . Alcohol use No  . Drug use: No  . Sexual activity: No   Other Topics Concern  . None   Social History Narrative  Antonio Silva is 21 yo male that attended [employer].    He is in a day program and is doing well.    He lives with both  parents and has no siblings.    He enjoys attending his day program and walking    Allergies Allergies  Allergen Reactions  . Ketamine Other (See Comments)    CAUSES SEIZURES   . Other     Lactose    Physical Exam Ht 5\' 7"  (1.702 m)   Wt 161 lb 9.6 oz (73.3 kg)   BMI 25.31 kg/m  Refused to allow heart rate or BP measurement. General: alert, well developed, well nourished, in no acute distress, brown hair, blue eyes, right handed Head: normocephalic, no dysmorphic features Ears, Nose and Throat: Otoscopic: tympanic membranes normal; pharynx: oropharynx is pink without exudates or tonsillar hypertrophy Neck: supple, full range of motion, no cranial or cervical bruits Respiratory: auscultation clear Cardiovascular: no murmurs, pulses are normal Musculoskeletal: no skeletal deformities or apparent scoliosis Skin: no rashes or neurocutaneous lesions  Neurologic Exam  Mental Status: alert; standing for most of the examination; followed some simple commands; wary; was quiet and did not have any verbal eruptions Cranial Nerves: visual fields are full to double simultaneous stimuli; extraocular movements are full and conjugate; pupils are round reactive to light; funduscopic examination shows bilateral positive red reflex; impassive symmetric facial strength; midline tongue and uvula; turns to localize sound bilaterally Motor: normal functional strength, tone and mass; good fine motor movements Sensory: intact responses to noxious stimuli Coordination: no tremor or obvious incoordination Gait and Station: slightly broad based stiff gait and station; balance is adequate Reflexes: symmetric and diminished bilaterally; no clonus; bilateral flexor plantar responses  Impression 1. Autism spectrum disorder with accompanying intellectual impairment, requiring very substantial support (level 3), F84.0. 2. 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 3. Mixed receptive-expressive language disorder,  F80.2. 4. 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  Recommendations for plan of care [facility]. Antonio Silva has neither had nor required imaging or lab studies since the last visit. He is a 21 year old young man with history of autism spectrum disorder with limited language, intellectual disability and insomnia.  He also has a history of generalized convulsive epilepsy that is well controlled with antiepileptic medicines. I talked with Mom and commended her for being a strong advocate for Antonio Silva. Mom said that she and Dad were starting to make future plans for [facility] such as considering an [facility] provider or group home when they are no longer able to care for him. We talked about the process for that and I told Mom that I would help her as needed in this process. We also talked about Bijan's behavioral outbursts and I recommended that she increase the Risperidone to 2ml twice per day. I asked her to let me know if he had side effects from this or if she had other questions or concerns. I will see Antonio Silva back in follow up in 6 months or sooner if needed. Mom agreed with these plans.   The medication list was reviewed and reconciled.  I reviewed changes that were made in the prescribed medications today.  A complete medication list was provided to the patient's mother.  Allergies as of 02/13/2017      Reactions   Ketamine Other (See Comments)   CAUSES SEIZURES    Other    Lactose      Medication List       Accurate as  of 02/13/17 11:59 PM. Always use your most recent med list.          BIOGAIA PROBIOTIC Liqd Take 5 mLs by mouth daily at 8 pm.   diazepam 5 MG tablet Commonly known as:  VALIUM TAKE 1 TABLET EVERY 6 HOURS AS NEEDED FOR ANXIETY   hydrOXYzine 10 MG/5ML syrup Commonly known as:  ATARAX TAKE 25 MLS BY MOUTH EVERY NIGHT AT BEDTIME   lamoTRIgine 25 MG Chew chewable tablet Commonly known as:  LAMICTAL TAKE 4 TABLETS TWICE DAILY     lansoprazole 30 MG disintegrating tablet Commonly known as:  PREVACID SOLUTAB Take 1 tablet (30 mg total) by mouth daily.   multivitamin Liqd Take 5 mLs by mouth daily.   omeprazole 20 MG capsule Commonly known as:  PRILOSEC Take 1 capsule (20 mg total) by mouth 2 (two) times daily before a meal. CAN OPEN CAPSULES AND PLACE IN FOOD OR DRINK.   polyethylene glycol powder powder Commonly known as:  GLYCOLAX/MIRALAX Take 17 g by mouth 3 (three) times daily.   risperiDONE 1 MG/ML oral solution Commonly known as:  RISPERDAL Take 2ml BID   sertraline 20 MG/ML concentrated solution Commonly known as:  ZOLOFT TAKE 0.5 ML IN THE MORNING   traZODone 50 MG tablet Commonly known as:  DESYREL Take 75 mg by mouth daily.       Dr. Sharene SkeansHickling was consulted regarding the patient.   Total time spent with the patient was 30 minutes, of which 50% or more was spent in counseling and coordination of care.   Elveria Risingina Fuad Forget NP-C

## 2017-02-14 MED ORDER — SERTRALINE HCL 20 MG/ML PO CONC: ORAL | 5 refills | Status: DC

## 2017-02-14 MED ORDER — LAMOTRIGINE 25 MG PO CHEW: CHEWABLE_TABLET | ORAL | 5 refills | Status: DC

## 2017-02-14 MED ORDER — RISPERIDONE 1 MG/ML PO SOLN: ORAL | 5 refills | Status: DC

## 2017-02-14 MED ORDER — DIAZEPAM 5 MG PO TABS: ORAL_TABLET | ORAL | 5 refills | Status: DC

## 2017-02-14 NOTE — Patient Instructions (Signed)
Let me know if Antonio Silva has any seizures or if you have any concerns.   Please plan to return for follow up in 6 months or sooner if needed.

## 2017-02-15 ENCOUNTER — Other Ambulatory Visit (INDEPENDENT_AMBULATORY_CARE_PROVIDER_SITE_OTHER): Payer: Self-pay | Admitting: Family

## 2017-02-15 DIAGNOSIS — G4701 Insomnia due to medical condition: Secondary | ICD-10-CM

## 2017-02-15 DIAGNOSIS — F840 Autistic disorder: Secondary | ICD-10-CM

## 2017-02-15 DIAGNOSIS — F419 Anxiety disorder, unspecified: Secondary | ICD-10-CM

## 2017-02-15 MED ORDER — DIAZEPAM 5 MG PO TABS: ORAL_TABLET | ORAL | 5 refills | Status: DC

## 2017-02-15 MED ORDER — HYDROXYZINE HCL 10 MG/5ML PO SYRP: ORAL_SOLUTION | ORAL | 5 refills | Status: DC

## 2017-07-26 ENCOUNTER — Encounter: Payer: Self-pay | Admitting: Family Medicine

## 2017-07-26 NOTE — Telephone Encounter (Signed)
Pt has seen no resolution to his gastro issues.  He would like referral to [facility] [place].  Please advise

## 2017-07-27 ENCOUNTER — Other Ambulatory Visit: Payer: Self-pay | Admitting: Family Medicine

## 2017-07-27 DIAGNOSIS — R109 Unspecified abdominal pain: Secondary | ICD-10-CM

## 2017-07-27 NOTE — Progress Notes (Signed)
Referral for GI placed per patient's mother's request.   Oralia ManisSherin Alijah Akram, DO, PGY-1 [facility] 07/27/2017 9:38 PM

## 2017-08-04 ENCOUNTER — Other Ambulatory Visit (INDEPENDENT_AMBULATORY_CARE_PROVIDER_SITE_OTHER): Payer: Self-pay | Admitting: Family

## 2017-08-04 DIAGNOSIS — G40309 Generalized idiopathic epilepsy and epileptic syndromes, not intractable, without status epilepticus: Secondary | ICD-10-CM

## 2017-08-06 ENCOUNTER — Other Ambulatory Visit (INDEPENDENT_AMBULATORY_CARE_PROVIDER_SITE_OTHER): Payer: Self-pay | Admitting: Family

## 2017-08-06 DIAGNOSIS — G40309 Generalized idiopathic epilepsy and epileptic syndromes, not intractable, without status epilepticus: Secondary | ICD-10-CM

## 2017-08-17 ENCOUNTER — Ambulatory Visit (HOSPITAL_COMMUNITY)
Admission: EM | Admit: 2017-08-17 | Discharge: 2017-08-17 | Disposition: A | Discharge status: Home / Self Care | Payer: Medicaid Other | Attending: Family Medicine | Admitting: Family Medicine

## 2017-08-17 ENCOUNTER — Encounter (HOSPITAL_COMMUNITY): Payer: Self-pay | Admitting: Family Medicine

## 2017-08-17 DIAGNOSIS — J208 Acute bronchitis due to other specified organisms: Secondary | ICD-10-CM | POA: Diagnosis not present

## 2017-08-17 DIAGNOSIS — R05 Cough: Secondary | ICD-10-CM

## 2017-08-17 DIAGNOSIS — R059 Cough, unspecified: Secondary | ICD-10-CM

## 2017-08-17 MED ORDER — PREDNISOLONE 15 MG/5ML PO SYRP: ORAL_SOLUTION | ORAL | 0 refills | Status: DC

## 2017-08-17 MED ORDER — AMOXICILLIN 400 MG/5ML PO SUSR: ORAL | 0 refills | Status: DC

## 2017-08-17 NOTE — ED Triage Notes (Signed)
Pt here for cough, chills, body aches, vomiting and diarrhea x 1 week. Pt is non verbal and unable to express self and they are worried that he has PNA.

## 2017-08-17 NOTE — ED Provider Notes (Signed)
[facility]   [id number] 08/17/17 Arrival Time: 1031  ASSESSMENT & PLAN:  1. Acute bronchitis due to other specified organisms   2. Cough     Meds ordered this encounter  Medications  . amoxicillin (AMOXIL) 400 MG/5ML suspension    Sig: Take 12 ml po bid x 10 days    Dispense:  240 mL    Refill:  0    Order Specific Question:   Supervising Provider    Answer:   Elvina Sidle [[id number]]  . prednisoLONE (PRELONE) 15 MG/5ML syrup    Sig: Take 10 ml po qd x 5 days    Dispense:  100 mL    Refill:  0    Order Specific Question:   Supervising Provider    Answer:   Elvina Sidle [[id number]]    Reviewed expectations re: course of current medical issues. Questions answered. Outlined signs and symptoms indicating need for more acute intervention. Patient verbalized understanding. After Visit Summary given.   SUBJECTIVE: History from: family. Antonio Silva is a 22 y.o. male who presents with complaint of persistent cough. Reports abrupt onset several days ago. Described symptoms have gradually worsened since beginning.  ROS: As per HPI.   OBJECTIVE:  Vitals:   08/17/17 1113  Temp: 98.8 F (37.1 C)    General appearance: alert; no distress Eyes: PERRLA; EOMI; conjunctiva normal HENT: normocephalic; atraumatic; TMs normal; nasal mucosa normal; oral mucosa normal Neck: supple  Lungs: clear to auscultation bilaterally Heart: regular rate and rhythm Abdomen: soft, non-tender; bowel sounds normal; no masses or organomegaly; no guarding or rebound tenderness Back: no CVA tenderness Extremities: no cyanosis or edema; symmetrical with no gross deformities Skin: warm and dry Neurologic: normal gait; normal symmetric reflexes Psychological: alert and cooperative; normal mood and affect  Labs:  Labs Reviewed - No data to display  Imaging: No results found.  Allergies  Allergen Reactions  . Ketamine Other (See Comments)    CAUSES SEIZURES   . Other    Lactose    Past Medical History:  Diagnosis Date  . Anxiety   . Autistic disorder, current or active state   . Chronic constipation   . GERD (gastroesophageal reflux disease)   . Seizures (HCC)    Social History   Socioeconomic History  . Marital status: Single    Spouse name: Not on file  . Number of children: Not on file  . Years of education: Not on file  . Highest education level: Not on file  Social Needs  . Financial resource strain: Not on file  . Food insecurity - worry: Not on file  . Food insecurity - inability: Not on file  . Transportation needs - medical: Not on file  . Transportation needs - non-medical: Not on file  Occupational History  . Not on file  Tobacco Use  . Smoking status: Never Smoker  . Smokeless tobacco: Never Used  Substance and Sexual Activity  . Alcohol use: No  . Drug use: No  . Sexual activity: No  Other Topics Concern  . Not on file  Social History Narrative   Kadin is 22 yo male that attended [employer].    He is in a day program and is doing well.    He lives with both parents and has no siblings.    He enjoys attending his day program and walking   Family History  Problem Relation Age of Onset  . Heart attack Maternal Grandmother  Died at 5461  . Heart attack Maternal Grandfather        Died at 3270  . Breast cancer Paternal Grandmother        Died at 4470  . Asthma Mother   . Diabetes Mother   . Depression Mother   . Hypertension Mother   . Hyperlipidemia Mother   . Ulcerative colitis Father    History reviewed. No pertinent surgical history.   Deatra CanterOxford, Antonya Leeder J, FNP 08/17/17 1131

## 2017-08-21 NOTE — Telephone Encounter (Signed)
Mom called about referral to [facility] [place].  She hasnt heard anything. Please advise

## 2017-08-23 ENCOUNTER — Encounter: Payer: Self-pay | Admitting: Physician Assistant

## 2017-08-29 ENCOUNTER — Other Ambulatory Visit (INDEPENDENT_AMBULATORY_CARE_PROVIDER_SITE_OTHER): Payer: Self-pay | Admitting: Family

## 2017-08-29 DIAGNOSIS — F419 Anxiety disorder, unspecified: Secondary | ICD-10-CM

## 2017-08-29 DIAGNOSIS — G4720 Circadian rhythm sleep disorder, unspecified type: Secondary | ICD-10-CM

## 2017-08-29 DIAGNOSIS — F840 Autistic disorder: Secondary | ICD-10-CM

## 2017-09-04 ENCOUNTER — Encounter (INDEPENDENT_AMBULATORY_CARE_PROVIDER_SITE_OTHER): Payer: Self-pay | Admitting: Family

## 2017-09-07 ENCOUNTER — Other Ambulatory Visit: Payer: Self-pay | Admitting: Physician Assistant

## 2017-09-07 ENCOUNTER — Ambulatory Visit (INDEPENDENT_AMBULATORY_CARE_PROVIDER_SITE_OTHER): Payer: Medicaid Other | Admitting: Physician Assistant

## 2017-09-07 ENCOUNTER — Ambulatory Visit (INDEPENDENT_AMBULATORY_CARE_PROVIDER_SITE_OTHER)
Admission: RE | Admit: 2017-09-07 | Discharge: 2017-09-07 | Disposition: A | Discharge status: Home / Self Care | Payer: Medicaid Other | Source: Ambulatory Visit | Attending: Physician Assistant | Admitting: Physician Assistant

## 2017-09-07 ENCOUNTER — Encounter: Payer: Self-pay | Admitting: Physician Assistant

## 2017-09-07 VITALS — Ht 67.0 in | Wt 182.0 lb

## 2017-09-07 DIAGNOSIS — R140 Abdominal distension (gaseous): Secondary | ICD-10-CM | POA: Diagnosis not present

## 2017-09-07 DIAGNOSIS — R109 Unspecified abdominal pain: Secondary | ICD-10-CM

## 2017-09-07 DIAGNOSIS — K219 Gastro-esophageal reflux disease without esophagitis: Secondary | ICD-10-CM

## 2017-09-07 DIAGNOSIS — K5900 Constipation, unspecified: Secondary | ICD-10-CM

## 2017-09-07 NOTE — Progress Notes (Signed)
Subjective:    Patient ID: Antonio Silva A Klimaszewski, male    DOB: Nov 16, 1995, 22 y.o.   MRN: [id number]  HPI Antonio Silva is a 22 year old white male with a diagnosis of autism, he is nonverbal and has intellectual impairment. Also with epilepsy, history of anxiety, previous diagnosis of GERD and constipation. Patient's mother made the appointment today, for ongoing GI issues including abdominal bloating, distention, right family and caregiver interpreted as abdominal pain, and acid reflux. She says he has had problems with his bowels since he was a toddler that worsening problems over the past few years. He has had problems with recurrent fecal impactions. He has had prior GI evaluation with [name], and was also seen by [name]/surgery [facility] and underwent removal of a fecal impaction and anal dilation under anesthesia for what was felt to be overflow diarrhea in January 2018. Per the patient's mother she was not happy with the care there, says that Dr. Noe GensPeters basically told them that he could not helps him, and that Dr. Drue DunAshburn try to talk amount of doing the jar under anesthesia. She feels that doctors have not wanted to do procedures on him because of his autism. She is concerned that he has an underlying GI problem which causes him pain. He says he will often come home and lay flat on his stomach on his bed, she feels because his abdomen is hurting. She says he has episodes with holding his stomach and crying, and also frequently holds his chest, we'll have episodes of belching and burping. They are unaware of any dysphagia or odynophagia. He is currently on omeprazole 20 mg, once daily in the morning. Appetite is generally good though he does have periods of time with decrease in appetite and weight has been stable. He has been maintained on MiraLAX 17 g 3 times daily over the past couple of years. She says his bowel movements now are always loose, and usually he  does have some bowel movement on a daily basis. His stools are always malodorous, there is no melena or hematochezia.. Patient had CT scan in November 2017 which did show a large fecal impaction, was otherwise negative. He has not had any recent labs, as he will become uncooperative. Mother states he has been tested for celiac disease in the past and that was negative line Family history is pertinent for family history of ulcerative colitis in patient's father, and mother with IBS. He is on Atarax at bedtime, Lamictal twice daily Risperdal twice daily and Zoloft every morning. He has not had any recent medication changes  Review of Systems Pertinent positive and negative review of systems were noted in the above HPI section.  All other review of systems was otherwise negative.  Outpatient Encounter Medications as of 09/07/2017  Medication Sig  . diazepam (VALIUM) 5 MG tablet TAKE 1 TABLET EVERY 6 HOURS AS NEEDED FOR ANXIETY  . hydrOXYzine (ATARAX) 10 MG/5ML syrup TAKE 25 MLS BY MOUTH EVERY NIGHT AT BEDTIME  . lamotrigine (LAMICTAL) 25 MG disintegrating tablet TAKE 4 TABLETS TWICE DAILY  . Multiple Vitamin (MULTIVITAMIN) LIQD Take 5 mLs by mouth daily.  [name]. omeprazole (PRILOSEC) 20 MG capsule Take 1 capsule (20 mg total) by mouth 2 (two) times daily before a meal. CAN OPEN CAPSULES AND PLACE IN FOOD OR DRINK.  [name]. polyethylene glycol powder (GLYCOLAX/MIRALAX) powder Take 17 g by mouth 3 (three) times daily.  . risperiDONE (RISPERDAL) 1 MG/ML oral solution TAKE  TWICE A DAY  . sertraline (ZOLOFT) 20 MG/ML concentrated solution TAKE 0.5 ML IN THE MORNING  . amoxicillin (AMOXIL) 400 MG/5ML suspension Take 12 ml po bid x 10 days (Patient not taking: Reported on 09/07/2017)  . BIOGAIA PROBIOTIC (BIOGAIA PROBIOTIC) LIQD Take 5 mLs by mouth daily at 8 pm.  . [DISCONTINUED] lansoprazole (PREVACID SOLUTAB) 30 MG disintegrating tablet Take 1 tablet (30 mg total) by mouth daily. (Patient not taking: Reported on  09/07/2017)  . [DISCONTINUED] prednisoLONE (PRELONE) 15 MG/5ML syrup Take 10 ml po qd x 5 days (Patient not taking: Reported on 09/07/2017)  . [DISCONTINUED] traZODone (DESYREL) 50 MG tablet Take 75 mg by mouth daily.   No facility-administered encounter medications on file as of 09/07/2017.    Allergies  Allergen Reactions  . Ketamine Other (See Comments)    CAUSES SEIZURES   . Other     Lactose   Patient Active Problem List   Diagnosis Date Noted  . Impaction of colon (HCC) 06/03/2016  . Mixed receptive-expressive language disorder 03/11/2015  . 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 Otitis externa 01/22/2014  . 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 Anxiety 12/31/2013  . GERD (gastroesophageal reflux disease) 12/31/2013  . Chronic constipation 12/31/2013  . 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 Encounter for long-term (current) use of other medications 01/07/2013  . Autistic disorder, current or active state 01/07/2013   Social History   Socioeconomic History  . Marital status: Single    Spouse name: Not on file  . Number of children: Not on file  . Years of education: Not on file  . Highest education level: Not on file  Social Needs  . Financial resource strain: Not on file  . Food insecurity - worry: Not on file  . Food insecurity - inability: Not on file  . Transportation needs - medical: Not on file  . Transportation needs - non-medical: Not on file  Occupational History  . Not on file  Tobacco Use  . Smoking status: Never Smoker  . Smokeless tobacco: Never Used  Substance and Sexual Activity  . Alcohol use: No  . Drug use: No  . Sexual activity: No  Other Topics Concern  . Not on file  Social History Narrative   Antonio Silva is 22 yo male that attended [employer].    He is in a day program and is doing well.    He lives with both parents and has no  siblings.    He enjoys attending his day program and walking    Mr. Moncur's family history includes Asthma in his mother; Breast cancer in his paternal grandmother; Depression in his mother; Diabetes in his mother; Heart attack in his maternal grandfather and maternal grandmother; Hyperlipidemia in his mother; Hypertension in his mother; Ulcerative colitis in his father.      Objective:    Vitals:    Physical Exam well-developed young white male, nonverbal, accompanied by his mother and a caregiver. Vitals were not done. HEENT ;nontraumatic normocephalic EOMI PERRLA sclera anicteric, Cardiovascular; regular rate and rhythm with S1-S2 no murmur or gallop, Pulmonary ;clear bilaterally, Abdomen; soft, bowel sounds are present, patient pushed my hand away repeatedly and unable to do adequate abdominal exam, Rectal ;exam not done, extremities no clubbing cyanosis or edema skin warm and dry, Neuropsych ;Patient is autistic, nonverbal,  pacing around the room.       Assessment & Plan:   #  45 22 year old white male with autism, nonverbal, with intellectual impairment, brought in by his family today with concerns about ongoing GI issues. Patient is on omeprazole 20 mg every morning for reflux symptoms-no current dysphagia or odynophagia, occasionally may regurgitate.  Patient has had chronic bowel issues for many years, worsened over the past year or so and has had previous fecal impactions. He is being maintained on MiraLAX 3 times daily, concerns are with loose stools, abdominal bloating, distention and what is interpreted as abdominal pain by family  Patient has significant sensitivity, hypersensitivity to touch etc. I wonder if some of his abdominal distention is actually related to relaxation of the abdominal musculature. Chronic constipation and history of fecal impactions-medication is likely playing a role as is his autism and intellectual impairment making toileting difficult to manage i.e.  regular bathroom habits etc. Both Lamictal and Risperdal list side effects of abdominal pain and constipation diarrhea nausea etc. High-dose MiraLAX may also cause abdominal discomfort, gas bloating and diarrhea.  Plan; mother did not feel the patient would tolerate labs so we'll forego that for now Plain KUB today rule out fecal impaction Mom asked about virtual colonoscopy-which also requires bowel prep and she does not feel that he will do a bowel prep. We discussed endoscopic evaluation with colonoscopy and endoscopy which she would like him to have to rule out any underlying serious GI issues but does not think he will do a bowel prep Will schedule for CT of the abdomen and pelvis with contrast Continue omeprazole 20 mg by mouth every morning We'll start trial of Linzess 145 g by mouth daily, samples were given and have asked him to hold the MiraLAX while he is on the Linzess. Further plans pending results of above. Patient will be established with Dr. Leatha Gilding PA-C 09/07/2017   Cc: [name], DO

## 2017-09-07 NOTE — Patient Instructions (Signed)
If you are age 23 or older, your body mass index should be between 23-30. Your Body mass index is 28.51 kg/m. If this is out of the aforementioned range listed, please consider follow up with your Primary Care Provider.  If you are age 24 or younger, your body mass index should be between 19-25. Your Body mass index is 28.51 kg/m. If this is out of the aformentioned range listed, please consider follow up with your Primary Care Provider.   You have been given samples of Linzess. You may open 1 capsule daily and sprinkle in water or apple sauce. Try this for 1 week instead of Miralax and call the office back if helpful. Ask for Amy's nurse to get a prescription for Linzess.  You have been scheduled for a CT scan of the abdomen and pelvis at [facility] ([address]---this is in the same building as Press [facility]).   You are scheduled on Wednesday, March 6th at 4:00pm. You should arrive 15 minutes prior to your appointment time for registration. Please follow the written instructions below on the day of your exam:  WARNING: IF YOU ARE ALLERGIC TO IODINE/X-RAY DYE, PLEASE NOTIFY RADIOLOGY IMMEDIATELY AT [phone]! YOU WILL BE GIVEN A 13 HOUR PREMEDICATION PREP.  1) Do not eat after 12:00pm (4 hours prior to your test) Liquids are ok to have. 2) You have been given 2 bottles of oral contrast to drink. The solution may taste               better if refrigerated, but do NOT add ice or any other liquid to this solution. Shake             well before drinking.    Drink 1 bottle of contrast @ 2:00pm (2 hours prior to your exam)  Drink 1 bottle of contrast @ 3:00pm (1 hour prior to your exam)  ** Please take 2m of Valium 30 minutes prior to CT scan**  You may take any medications as prescribed with a small amount of water except for the following: Metformin, Glucophage, Glucovance, Avandamet, Riomet, Fortamet, Actoplus Met, Janumet, Glumetza or Metaglip. The above medications  must be held the day of the exam AND 48 hours after the exam.  The purpose of you drinking the oral contrast is to aid in the visualization of your intestinal tract. The contrast solution may cause some diarrhea. Before your exam is started, you will be given a small amount of fluid to drink. Depending on your individual set of symptoms, you may also receive an intravenous injection of x-ray contrast/dye. [facility], depending on the type of exam you are having performed.  This test typically takes 30-45 minutes to complete.  If you have any questions regarding your exam or if you need to reschedule, you may call the CT department at [phone] the hours of 8:00 am and 5:00 pm, Monday-Friday.  Please go to the basement for the KUB (Imaging study of the kidney, ureters and bladder). You do not need an appointment for this.  .Marland Kitchenottie

## 2017-09-08 ENCOUNTER — Encounter: Payer: Self-pay | Admitting: Physician Assistant

## 2017-09-08 NOTE — Progress Notes (Signed)
Physician assistant assessment and plan reviewed.Very complicatedpatient. This patient may be best served long-term at a tertiary care center to manage for significant motility disorders.in the interim, we will see if he responds to measures as outlined

## 2017-09-11 ENCOUNTER — Encounter: Payer: Self-pay | Admitting: Physician Assistant

## 2017-09-15 ENCOUNTER — Telehealth (INDEPENDENT_AMBULATORY_CARE_PROVIDER_SITE_OTHER): Payer: Self-pay | Admitting: Family

## 2017-09-15 ENCOUNTER — Ambulatory Visit (INDEPENDENT_AMBULATORY_CARE_PROVIDER_SITE_OTHER): Payer: Self-pay | Admitting: Family

## 2017-09-15 NOTE — Telephone Encounter (Signed)
Please advise if patient needs a return call to reschedule this appt.

## 2017-09-15 NOTE — Telephone Encounter (Signed)
°  Who's calling (name and relationship to patient) : [name] (mother)  Best contact number: [phone]  Provider they see: Elveria Risingina Goodpasture  Reason for call: Patients mother called to cancel appointment for today (09/15/2017) however she wanted to let Inetta Fermoina know that patient is doing fine and he will be transitioning to a 1 vs 1 group home at the end of April. She is requesting Inetta Fermoina gives her a call when she feels that patient needs to be seen again other than that she will make an appointment if patient condition changes.

## 2017-09-18 ENCOUNTER — Encounter: Payer: Self-pay | Admitting: Physician Assistant

## 2017-09-18 ENCOUNTER — Other Ambulatory Visit: Payer: Self-pay

## 2017-09-18 MED ORDER — LINACLOTIDE 145 MCG PO CAPS: 145.0000 ug | ORAL_CAPSULE | Freq: Every day | ORAL | 3 refills | Status: DC

## 2017-09-19 ENCOUNTER — Encounter (INDEPENDENT_AMBULATORY_CARE_PROVIDER_SITE_OTHER): Payer: Self-pay | Admitting: Family

## 2017-09-19 NOTE — Telephone Encounter (Signed)
I talked with Mom about this via MyChart. No call back is needed. TG

## 2017-09-20 ENCOUNTER — Other Ambulatory Visit: Payer: Self-pay | Admitting: Physician Assistant

## 2017-09-20 ENCOUNTER — Ambulatory Visit (INDEPENDENT_AMBULATORY_CARE_PROVIDER_SITE_OTHER)
Admission: RE | Admit: 2017-09-20 | Discharge: 2017-09-20 | Disposition: A | Discharge status: Home / Self Care | Payer: Medicaid Other | Source: Ambulatory Visit | Attending: Physician Assistant | Admitting: Physician Assistant

## 2017-09-20 DIAGNOSIS — K5900 Constipation, unspecified: Secondary | ICD-10-CM | POA: Diagnosis not present

## 2017-09-21 ENCOUNTER — Other Ambulatory Visit: Payer: Self-pay | Admitting: Family Medicine

## 2017-09-21 ENCOUNTER — Telehealth: Payer: Self-pay | Admitting: Family Medicine

## 2017-09-21 ENCOUNTER — Encounter: Payer: Self-pay | Admitting: Family Medicine

## 2017-09-21 MED ORDER — POLYETHYLENE GLYCOL 3350 17 GM/SCOOP PO POWD: 17.0000 g | Freq: Three times a day (TID) | ORAL | 11 refills | Status: DC

## 2017-09-21 NOTE — Telephone Encounter (Signed)
Refill for Miralax sent to [facility] on [address]   Orpah ClintonSherin Hollye Pritt, DO, PGY-1 [facility] 09/21/2017 4:03 PM

## 2017-10-02 ENCOUNTER — Encounter: Payer: Self-pay | Admitting: Physician Assistant

## 2017-10-03 ENCOUNTER — Encounter: Payer: Self-pay | Admitting: Physician Assistant

## 2017-10-10 ENCOUNTER — Other Ambulatory Visit: Payer: Self-pay

## 2017-10-10 MED ORDER — LINACLOTIDE 290 MCG PO CAPS: 290.0000 ug | ORAL_CAPSULE | Freq: Every day | ORAL | 3 refills | Status: DC

## 2017-10-13 ENCOUNTER — Ambulatory Visit (INDEPENDENT_AMBULATORY_CARE_PROVIDER_SITE_OTHER): Payer: Medicaid Other | Admitting: Family

## 2017-10-14 ENCOUNTER — Other Ambulatory Visit (INDEPENDENT_AMBULATORY_CARE_PROVIDER_SITE_OTHER): Payer: Self-pay | Admitting: Family

## 2017-10-14 DIAGNOSIS — F840 Autistic disorder: Secondary | ICD-10-CM

## 2017-10-14 DIAGNOSIS — F419 Anxiety disorder, unspecified: Secondary | ICD-10-CM

## 2017-10-16 ENCOUNTER — Ambulatory Visit (INDEPENDENT_AMBULATORY_CARE_PROVIDER_SITE_OTHER): Payer: Medicaid Other | Admitting: Family

## 2017-10-16 ENCOUNTER — Encounter (INDEPENDENT_AMBULATORY_CARE_PROVIDER_SITE_OTHER): Payer: Self-pay | Admitting: Family

## 2017-10-16 DIAGNOSIS — F419 Anxiety disorder, unspecified: Secondary | ICD-10-CM

## 2017-10-16 DIAGNOSIS — F840 Autistic disorder: Secondary | ICD-10-CM | POA: Diagnosis not present

## 2017-10-16 DIAGNOSIS — G4701 Insomnia due to medical condition: Secondary | ICD-10-CM | POA: Diagnosis not present

## 2017-10-16 DIAGNOSIS — G4720 Circadian rhythm sleep disorder, unspecified type: Secondary | ICD-10-CM

## 2017-10-16 DIAGNOSIS — G40309 Generalized idiopathic epilepsy and epileptic syndromes, not intractable, without status epilepticus: Secondary | ICD-10-CM | POA: Diagnosis not present

## 2017-10-16 NOTE — Progress Notes (Signed)
Patient: Antonio Silva MRN: [id number] Sex: male DOB: 1996-02-10  Provider: [name], NP Location of Care: [facility]  Note type: Routine return visit  History of Present Illness: Referral Source: Everlene Other, DO History from: mother and aide and [facility] chart Chief Complaint: Autism spectrum disorder/anxiety/Epilepsy  Antonio Silva is a 22 y.o. young man with history of autism spectrum disorder with limited language, intellectual disability, insomnia and generalized convulsive epilepsy that is well controlled with medication. He was last seen February 13, 2017. Antonio Silva is taking and tolerating Lamotrigine for his seizure disorder and has remained seizure free since July 2015.   Antonio Silva is cared for at home by his parents and CAPS workers, but he is in the process of transitioning to an AFL provider home. Mom tells me today that the home is currently having some renovations done and then he will be moving there with his AFL provider.   Mom reports today that Antonio Silva has been experiencing ongoing problems with constipation, abdominal pain and dysmotility. She said that he was recently seen by gastroenterology and is being referred to a motility specialist at [facility]. Mom said that his behavior is generally good but that when he has constipation or abdominal pain that he tends to be more aggressive with his caregivers. Antonio Silva has had longstanding problems with insomnia and Mom reports today that he continues to have difficulty going to sleep and staying asleep. He is restless at night and can be agitated at times.  Antonio Silva has been otherwise healthy since his last visit and neither his mother nor his CAPS have other health concerns for Antonio Silva other than previously mentioned.   Review of Systems: Please see the HPI for neurologic and other pertinent review of systems. Otherwise, all other systems were reviewed and were negative.    Past Medical History:  Diagnosis Date    . Anxiety   . Autistic disorder, current or active state   . Chronic constipation   . GERD (gastroesophageal reflux disease)   . Seizures (HCC)    Hospitalizations: No., Head Injury: No., Nervous System Infections: No., Immunizations up to date: Yes.   Past Medical History Comments: See HPI  Birth History 6 lbs. 11 oz. infant born at full-term to a 63 year old primigravida.  Mother gained 30 pounds  Labor lasted for 10 hours  Normal spontaneous vaginal delivery  Delayed developmental milestones particularly for language.  Behavior History autism spectrum disorder, restless, agitated, self-injurious   Surgical History Past Surgical History:  Procedure Laterality Date  . WISDOM TOOTH EXTRACTION     age 70    Family History family history includes Asthma in his mother; Breast cancer in his paternal grandmother; Depression in his mother; Diabetes in his mother; Heart attack in his maternal grandfather and maternal grandmother; Hyperlipidemia in his mother; Hypertension in his mother; Ulcerative colitis in his father. Family History is otherwise negative for migraines, seizures, cognitive impairment, blindness, deafness, birth defects, chromosomal disorder, autism.  Social History Social History   Socioeconomic History  . Marital status: Single    Spouse name: Not on file  . Number of children: Not on file  . Years of education: Not on file  . Highest education level: Not on file  Occupational History  . Not on file  Social Needs  . Financial resource strain: Not on file  . Food insecurity:    Worry: Not on file    Inability: Not on file  . Transportation needs:  Medical: Not on file    Non-medical: Not on file  Tobacco Use  . Smoking status: Never Smoker  . Smokeless tobacco: Never Used  Substance and Sexual Activity  . Alcohol use: No  . Drug use: No  . Sexual activity: Never  Lifestyle  . Physical activity:    Days per week: Not on file    Minutes  per session: Not on file  . Stress: Not on file  Relationships  . Social connections:    Talks on phone: Not on file    Gets together: Not on file    Attends religious service: Not on file    Active member of club or organization: Not on file    Attends meetings of clubs or organizations: Not on file    Relationship status: Not on file  Other Topics Concern  . Not on file  Social History Narrative   Antonio Silva is 22 yo male that attended [employer].    He is in a day program and is doing well.    He lives with both parents and has no siblings.    He enjoys attending his day program and walking    Allergies Allergies  Allergen Reactions  . Ketamine Other (See Comments)    CAUSES SEIZURES   . Other     Lactose    Physical Exam Pulse 86   Ht 5\' 7"  (1.702 m)   Wt 184 lb 12.8 oz (83.8 kg)   BMI 28.94 kg/m  He refused to allow BP measurement today.  General: well developed, well nourished young man, pacing in exam room, in no evident distress; brown hair, blue eyes, right handed Head: normocephalic and atraumatic. No dysmorphic features. He would not allow examination of his oropharynx Neck: supple with no carotid bruits.  Cardiovascular: regular rate and rhythm, no murmurs. Respiratory: Clear to auscultation bilaterally Abdomen: Bowel sounds present all four quadrants, abdomen soft, non-tender, non-distended. No hepatosplenomegaly or masses palpated. Musculoskeletal: No skeletal deformities or obvious scoliosis. Tends to hold his hands in an outstretched position. Skin: no rashes or neurocutaneous lesions  Neurologic Exam Mental Status: Awake and fully alert. Pacing in the exam room for the entire visit, occasionally going to his mother or caregiver and saying "Bee" which is a video that he is going to be allowed to watch after this visit. Unable to follow commands. Resisted invasions into his space for the most part.  Cranial Nerves: Fundoscopic exam - red reflex present.   Unable to fully visualize fundus.  Pupils equal briskly reactive to light.  Started to noises outside the exam room. Face moves normally and symmetrically.  Neck flexion and extension normal. Motor: Normal functional strength, tone and mass Sensory: Withdrawal x 4 Coordination: Unable to adequately assess due to his inability to cooperate. Balance is adequate Gait and Station: Stance is normal.  Gait demonstrates normal stride length and balance. Able to walk normally.  Reflexes: Unable to assess due to his inability to cooperate   Impression 1. Autism spectrum disorder with accompanying intellectual impairment, requiring very substantial support, F84.0 2. Generalized convulsive epilepsy, G40.309 3.  Mixed receptive-expressive language disorder, F80.2 4.  Circadian rhythm sleep disorder, F47.20   Recommendations for plan of care The [facility] were [facility]. Antonio Silva has neither had nor required imaging or lab studies since the last visit. He is a 22 year old young man with history of autism spectrum disorder, generalized convulsive epilepsy in good control, language delay, intellectual disability and  insomnia. He is taking and tolerating Lamotrigine for his seizure order and will continue on this medication without change. Seldon will be moving to an AFL provider home within the next month or so and we talked about that transition. He knows his AFL provider and gets along well with her family, so Mom is hopeful that he will transition well. I will see Shelly back in follow up in 1 year or sooner if needed.   The medication list was reviewed and reconciled.  No changes were made in the prescribed medications today.  A complete medication list was provided to his mother.  Allergies as of 10/16/2017      Reactions   Ketamine Other (See Comments)   CAUSES SEIZURES    Other    Lactose      Medication List        Accurate as of 10/16/17 11:59 PM. Always use your most  recent med list.          amoxicillin 400 MG/5ML suspension Commonly known as:  AMOXIL Take 12 ml po bid x 10 days   BIOGAIA PROBIOTIC Liqd Take 5 mLs by mouth daily at 8 pm.   diazepam 5 MG tablet Commonly known as:  VALIUM TAKE 1 TABLET EVERY 6 HOURS AS NEEDED FOR ANXIETY   hydrOXYzine 10 MG/5ML syrup Commonly known as:  ATARAX TAKE 25 MLS BY MOUTH EVERY NIGHT AT BEDTIME   lamotrigine 25 MG disintegrating tablet Commonly known as:  LAMICTAL TAKE 4 TABLETS TWICE DAILY   linaclotide 290 MCG Caps capsule Commonly known as:  LINZESS Take 1 capsule (290 mcg total) by mouth daily before breakfast.   multivitamin Liqd Take 5 mLs by mouth daily.   omeprazole 20 MG capsule Commonly known as:  PRILOSEC Take 1 capsule (20 mg total) by mouth 2 (two) times daily before a meal. CAN OPEN CAPSULES AND PLACE IN FOOD OR DRINK.   polyethylene glycol powder powder Commonly known as:  GLYCOLAX/MIRALAX Take 17 g by mouth 3 (three) times daily.   risperiDONE 1 MG/ML oral solution Commonly known as:  RISPERDAL TAKE TWICE A DAY   sertraline 20 MG/ML concentrated solution Commonly known as:  ZOLOFT TAKE 0.5 ML IN THE MORNING       Total time spent with the patient was 15 minutes, of which 50% or more was spent in counseling and coordination of care.   [name] NP-C

## 2017-10-17 ENCOUNTER — Encounter (INDEPENDENT_AMBULATORY_CARE_PROVIDER_SITE_OTHER): Payer: Self-pay | Admitting: Family

## 2017-10-17 ENCOUNTER — Telehealth: Payer: Self-pay | Admitting: Family Medicine

## 2017-10-17 MED ORDER — DIAZEPAM 5 MG PO TABS: ORAL_TABLET | ORAL | 5 refills | Status: DC

## 2017-10-17 MED ORDER — SERTRALINE HCL 20 MG/ML PO CONC: ORAL | 5 refills | Status: DC

## 2017-10-17 MED ORDER — LAMOTRIGINE 25 MG PO TBDP: ORAL_TABLET | ORAL | 5 refills | Status: DC

## 2017-10-17 MED ORDER — HYDROXYZINE HCL 10 MG/5ML PO SYRP: ORAL_SOLUTION | ORAL | 5 refills | Status: DC

## 2017-10-17 MED ORDER — RISPERIDONE 1 MG/ML PO SOLN: ORAL | 5 refills | Status: DC

## 2017-10-17 NOTE — Telephone Encounter (Signed)
[facility] in [place] called wanting to let Dr Darin EngelsAbraham know that the form she sent back signed had questions that were not completed. [facility] is going to send the form again. They asked for the questions to be answered and for signed and sent back to them.

## 2017-10-17 NOTE — Patient Instructions (Signed)
Thank you for coming in today.   Instructions for you until your next appointment are as follows: 1. Continue Aziah's medications as you have been giving them.  2. Let me know if he has any seizures or if you have other concerns.  3. Please plan to return for follow up in one year or sooner if needed.

## 2017-10-18 ENCOUNTER — Encounter: Payer: Self-pay | Admitting: Physician Assistant

## 2017-10-19 NOTE — Telephone Encounter (Signed)
Per chart review, patient has not been seen in clinic since 2015. In order to provide the best care to the patient we will no longer be able to fill out paperwork or refill prescriptions until patient is seen in clinic. If patient has a new PCP refills/paperwork should be forwarded to them.   Discussed with Dr. Bobbie StackHensel  Staci Carver, DO, PGY-1 [facility] 10/19/2017 1:46 PM

## 2017-10-20 ENCOUNTER — Encounter: Payer: Self-pay | Admitting: Family Medicine

## 2017-11-14 ENCOUNTER — Encounter: Payer: Self-pay | Admitting: Family Medicine

## 2017-11-17 ENCOUNTER — Encounter: Payer: Self-pay | Admitting: Family Medicine

## 2017-11-19 NOTE — Progress Notes (Signed)
Subjective:  CC -- Annual Physical; Without complaints  Pt is nonverbal but accompanied by caregiver, Adelfa Koh, who will be caring for patient when he moves to ALF.   Stomach pain Per Judson Roch patient has been following up with GI for abdominal pain. States he saw [facility] and had CT scan showing stool burdon. Patient has history of fecal impaction which required disimpaction under sedation at [facility] Since disimpaction patient has been needing pull ups due to poor anal tone. Patient has follow up at [facility] intestinal motility specialist on Thursday 11/23/17.   Cardiovascular: - Dx Hypertension: no  - Dx Hyperlipidemia: no   - Dx Obesity: yes, class I   - Diabetes: no   Cancer: Colorectal >> Colonoscopy: N/A Lung >> Tobacco Use: no  Prostate >> Interested in DRE and/or PSA: no  Skin >> Suspicious lesions: no   Social: Alcohol Use: no  Tobacco Use: no  Other Drugs: no  Risky Sexual Behavior: no, not sexually active  Depression: non verbal   - PHQ9 score: unable to obtain as patient is non verbal and unable to fill out form  Support and Life at Home: is moving to ALF, mother and father both involved in his care    Other: Osteoporosis: no   Zoster Vaccine: no   Flu Vaccine: yes in 2018 Pneumonia Vaccine: no    Past Medical History Patient Active Problem List   Diagnosis Date Noted  . Impaction of colon (HCC) 06/03/2016  . Mixed receptive-expressive language disorder 03/11/2015  . 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 Otitis externa 01/22/2014  . 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 Anxiety 12/31/2013  . GERD (gastroesophageal reflux disease) 12/31/2013  . Chronic constipation 12/31/2013  . 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 Healthcare maintenance 01/07/2013  . Autistic disorder, current or active state 01/07/2013    Medications- reviewed and  updated Current Outpatient Medications  Medication Sig Dispense Refill  . diazepam (VALIUM) 5 MG tablet Take 1 tablet every 6 hours as needed for anxiety 30 tablet 5  . hydrOXYzine (ATARAX) 10 MG/5ML syrup TAKE 25 MLS BY MOUTH EVERY NIGHT AT BEDTIME 775 mL 5  . lamotrigine (LAMICTAL) 25 MG disintegrating tablet TAKE 4 TABLETS TWICE DAILY 240 tablet 5  . omeprazole (PRILOSEC) 20 MG capsule Take 1 capsule (20 mg total) by mouth 2 (two) times daily before a meal. CAN OPEN CAPSULES AND PLACE IN FOOD OR DRINK. 60 capsule 11  . polyethylene glycol powder (GLYCOLAX/MIRALAX) powder Take 17 g by mouth 3 (three) times daily. 1530 g 11  . risperiDONE (RISPERDAL) 1 MG/ML oral solution TAKE TWICE A DAY 120 mL 5  . sertraline (ZOLOFT) 20 MG/ML concentrated solution TAKE 0.5 ML IN THE MORNING 30 mL 5  . BIOGAIA PROBIOTIC (BIOGAIA PROBIOTIC) LIQD Take 5 mLs by mouth daily at 8 pm.    . Multiple Vitamin (MULTIVITAMIN) LIQD Take 5 mLs by mouth daily.     No current facility-administered medications for this visit.     Objective: Wt 185 lb (83.9 kg)   BMI 28.98 kg/m   Unable to obtain other vitals  Gen: NAD, alert, difficult exam but cooperative when asked by caretaker  HEENT: NCAT, PERRL, tympanic membranes visualized bilaterally and normal bilaterally  CV: RRR, good S1/S2, no murmur Resp: CTABL, no wheezes, non-labored Abd: Soft, Non Tender, Non Distended, BS present, no guarding or organomegaly Genital Exam: not done Ext: No  edema, warm Neuro: Alert, No gross deficits, unable to perform in depth neuro exam due to non cooperation, able to move all extremities   Assessment/Plan:  Chronic constipation Currently followed by [facility] intestinal motility specialty. Patient with follow up appointment on 11/23/17. No further workup from PCP needed at this time.   Healthcare maintenance Tetanus vaccine administered today.  Immunization records given to caretaker.    Orders Placed This  Encounter  Procedures  . Tdap vaccine greater than or equal to 7yo IM    No orders of the defined types were placed in this encounter.  Return in about 1 year (around 11/22/2018).   [name], DO, PGY-1 11/21/2017 3:41 PM

## 2017-11-21 ENCOUNTER — Other Ambulatory Visit: Payer: Self-pay

## 2017-11-21 ENCOUNTER — Encounter: Payer: Self-pay | Admitting: Family Medicine

## 2017-11-21 ENCOUNTER — Ambulatory Visit (INDEPENDENT_AMBULATORY_CARE_PROVIDER_SITE_OTHER): Payer: Medicaid Other | Admitting: Family Medicine

## 2017-11-21 DIAGNOSIS — K5909 Other constipation: Secondary | ICD-10-CM | POA: Diagnosis not present

## 2017-11-21 DIAGNOSIS — Z0000 Encounter for general adult medical examination without abnormal findings: Secondary | ICD-10-CM | POA: Diagnosis not present

## 2017-11-21 DIAGNOSIS — Z23 Encounter for immunization: Secondary | ICD-10-CM | POA: Diagnosis present

## 2017-11-21 NOTE — Patient Instructions (Signed)
It was a pleasure seeing you today.   Today we discussed your well visit  For Antonio Silva's well visit: he did very well today. He had normal ear, eye, heart, and lung findings. I was able to examine is abdomen and he had good bowel sounds. Today we will give him his tetanus vaccine and provide an immunization record.  Please follow up in 1 year or sooner if symptoms persist or worsen. Please call the clinic immediately if you have any concerns.   Our clinic's number is [phone]. Please call with questions or concerns.   Thank you,  [name], DO

## 2017-11-21 NOTE — Assessment & Plan Note (Signed)
Tetanus vaccine administered today.  Immunization records given to caretaker.

## 2017-11-21 NOTE — Assessment & Plan Note (Signed)
Currently followed by [facility] intestinal motility specialty. Patient with follow up appointment on 11/23/17. No further workup from PCP needed at this time.

## 2017-11-29 ENCOUNTER — Other Ambulatory Visit: Payer: Self-pay | Admitting: Family Medicine

## 2017-11-30 IMAGING — CT CT ABD-PELV W/ CM
2 of 4 series · 9 of 46 positions shown, 10 images · IV contrast (Iodine)
Comparison: No priors.

CLINICAL DATA: 20-year-old male with abdominal pain intermittently
for the past year, worsening over the past 6 months. Intermittent
gagging.

EXAM:
CT ABDOMEN AND PELVIS WITH CONTRAST
TECHNIQUE: Multidetector CT imaging of the abdomen and pelvis was performed
using the standard protocol following bolus administration of
intravenous contrast.
CONTRAST:  100mL BZT430-JCC IOPAMIDOL (BZT430-JCC) INJECTION 61%

[Series 201: routine, idose (2) · axial · 0.78mm/px · z∈[+79,+504]mm · 6 of 103 slices shown, 7 images]
[im 9/103  soft-tissue]
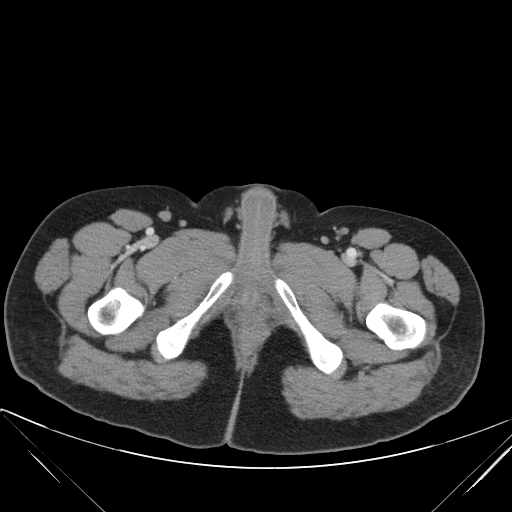
[im 9/103  bone]
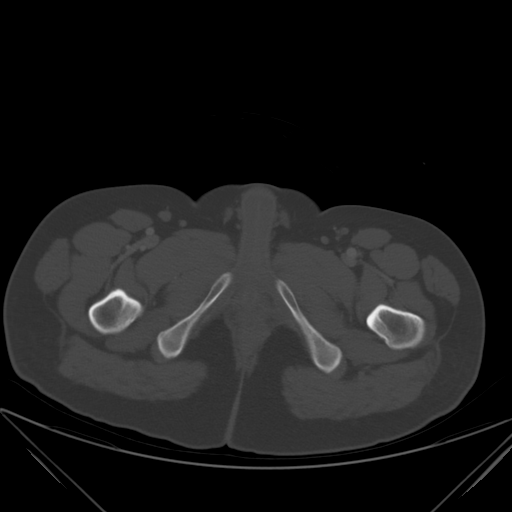
[im 26/103  soft-tissue]
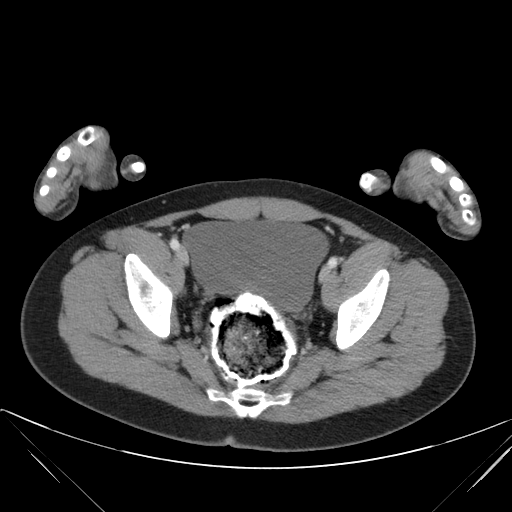
[im 43/103  soft-tissue]
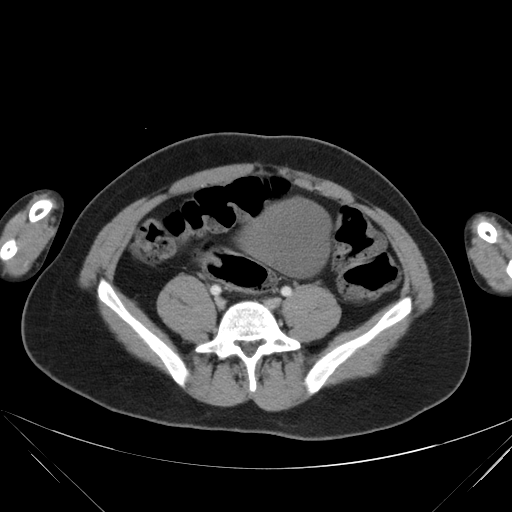
[im 60/103  soft-tissue]
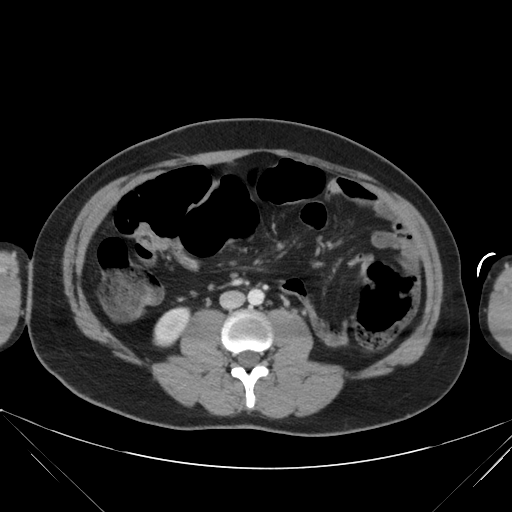
[im 77/103  soft-tissue]
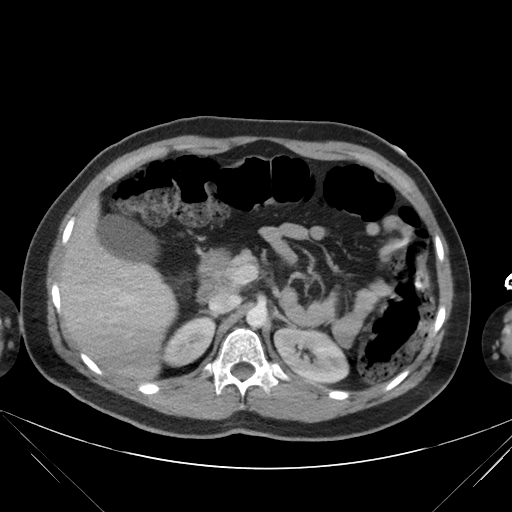
[im 94/103  soft-tissue]
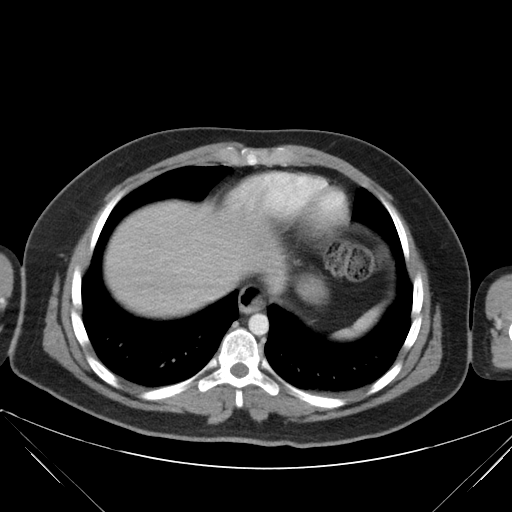

[Series 203: coronals, idose (2) · coronal · 0.45mm/px · 3 of 129 slices shown]
[im 43/129  soft-tissue]
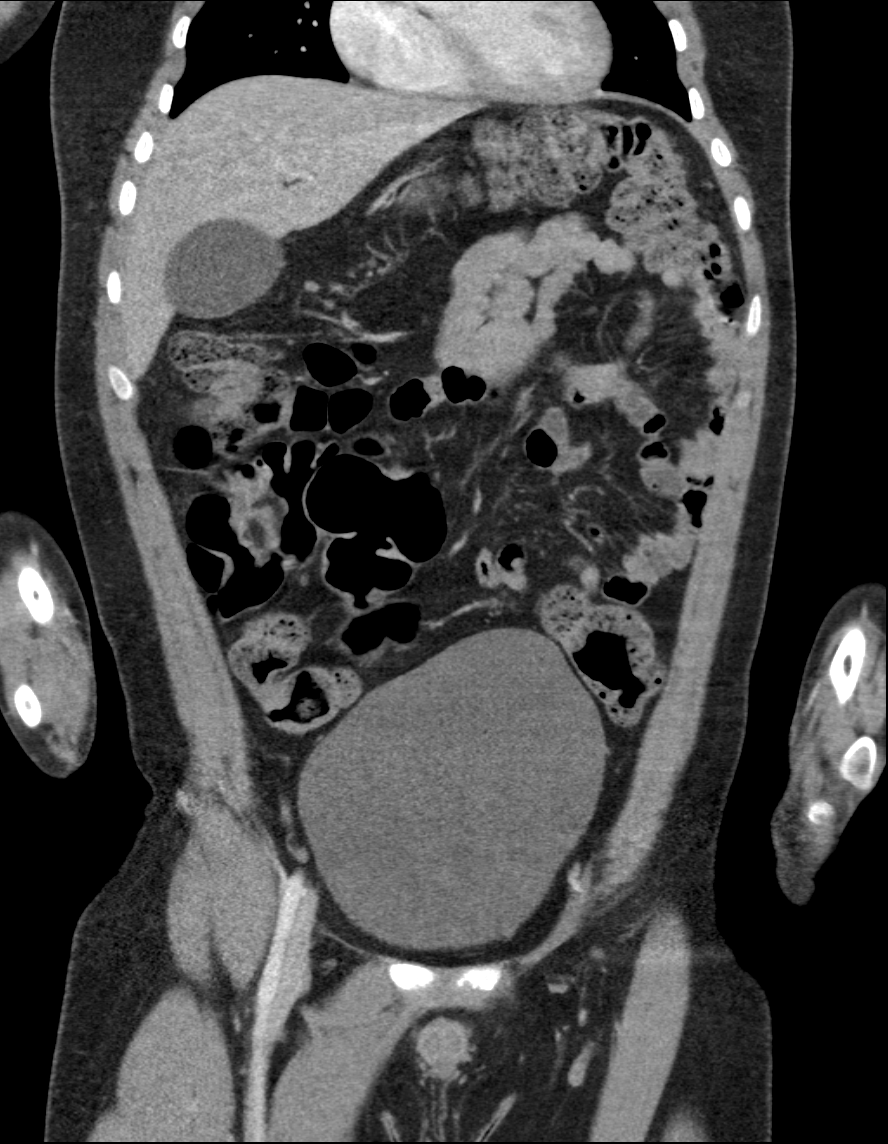
[im 57/129  soft-tissue]
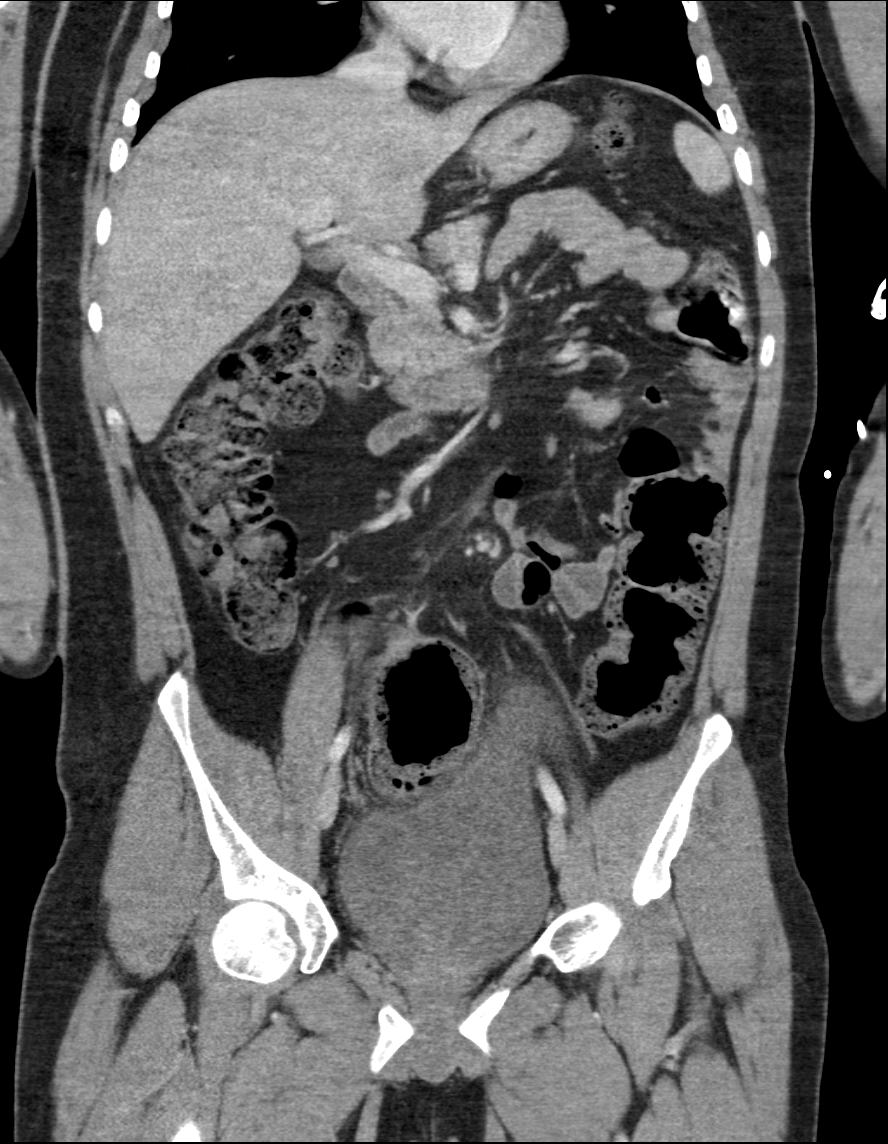
[im 72/129  soft-tissue]
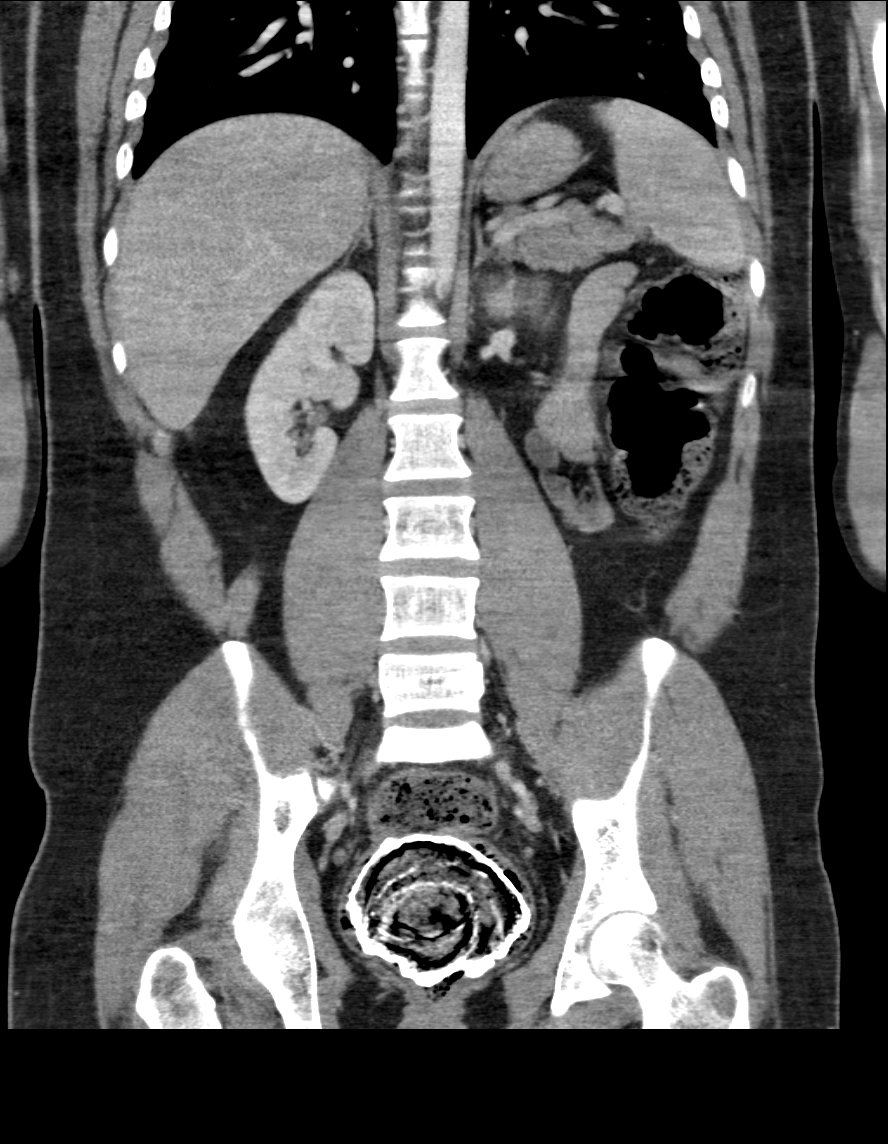

[9 of 46 positions shown; findings below may reference images not displayed]

FINDINGS: Lower chest: Patchy areas of peribronchovascular ground-glass
attenuation are noted in the lung bases, most evident in the left
lower lobe, concerning for sequela of aspiration or developing
infection.

Hepatobiliary: No cystic or solid hepatic lesions. No intra or
extrahepatic biliary ductal dilatation. Gallbladder is normal in
appearance.

Pancreas: No pancreatic mass. No pancreatic ductal dilatation. No
pancreatic or peripancreatic fluid or inflammatory changes.

Spleen: Unremarkable.

Adrenals/Urinary Tract: Bilateral adrenal glands and bilateral
kidneys are normal in appearance. No hydroureteronephrosis. Urinary
bladder is normal in appearance.

Stomach/Bowel: Normal appearance of the stomach. No pathologic
dilatation of small bowel or colon. The appendix is not confidently
identified and may be surgically absent. Regardless, there are no
inflammatory changes noted adjacent to the cecum to suggest the
presence of an acute appendicitis at this time. In the distal rectum
there is a large lesion measuring 7.3 x 8.7 x 6.9 cm which
demonstrates multiple areas of dense calcification peripherally,
favored to represent a calcified stool ball, potentially indicative
of chronic but incomplete fecal impaction. This is also clearly
evident on the scout topogram, and appears to be new compared to
01/24/2007 abdominal radiograph.

Vascular/Lymphatic: No significant atherosclerotic disease, aneurysm
or dissection identified in the abdominal or pelvic vasculature. No
lymphadenopathy noted in the abdomen or pelvis.

Reproductive: Prostate gland and seminal vesicles are unremarkable
in appearance.

Other: No significant volume of ascites.  No pneumoperitoneum.

Musculoskeletal: There are no aggressive appearing lytic or blastic
lesions noted in the visualized portions of the skeleton.
IMPRESSION: 1. No acute findings in the abdomen or pelvis.
2. However, there is a large peripherally calcified structure in the
distal rectum which may suggest chronic but incomplete fecal
impaction. This is clearly identifiable on the scout topogram, which
suggests at this could be followed by serial abdominal radiographs
after attempted disimpaction and/or enemas.
3. Patchy areas of peribronchovascular ground-glass attenuation in
the lung bases bilaterally, most evident in the left lower lobe,
concerning for sequela of aspiration or developing bronchopneumonia.
Clinical correlation is recommended.
These results were called by telephone at the time of interpretation
on 05/31/2016 at [DATE] to Dr. FRANSUA DE TALLANA, who verbally
acknowledged these results.

## 2017-12-19 ENCOUNTER — Encounter: Payer: Self-pay | Admitting: Family Medicine

## 2017-12-21 NOTE — Progress Notes (Signed)
   Subjective:    Patient ID: Antonio Silva, male    DOB: June 04, 1996, 22 y.o.   MRN: [id number]   CC: Rash on bottom  HPI: Rash Patient presenting today with caretaker.  History obtained from caretaker as patient is nonverbal.  Patient presented with chief complaint of rash that began over the weekend, caretaker says he was with her on Friday and did not have a rash.  Rash is in the gluteal cleft and radiates to groin.  Rash appears to be irritating patient as he appears to be in pain during diaper changes.  Does not scratch rash so caretaker unsure if it is itching.  Parents have first tried A and D ointment, with no improvement.  They then began  Zinc oxide cream on Wednesday, still no significant improvement and appears to spread some.  Parents state that she was recently placed on Augmentin on 12-13-17 by GI motility doctor, and seems to think rash started after that.  Parents also report diarrhea, however caretaker says she has not noted any.  States his bowel movements are soft and mushy.  Denies fever and chills.  Denies nausea vomiting.   Objective:  BP 140/80   Ht 5\' 7"  (1.702 m)   Wt 186 lb (84.4 kg)   BMI 29.13 kg/m  Vitals and nursing note reviewed  General: well nourished, very anxious during exam  HEENT: normocephalic  Cardiac: RRR, clear S1 and S2, no murmurs, rubs, or gallops Respiratory: clear to auscultation bilaterally, no increased work of breathing Abdomen: soft, nontender, nondistended, no masses or organomegaly. Bowel sounds present Extremities: no edema or cyanosis. Warm, well perfused.  Skin: warm and dry, erythematous rash on gluteal cleft with small satellite lesions, area was moist with some white regions (likely from zinc oxide ointment), difficult exam as patient became combative during exam  Neuro: alert and oriented, no focal deficits   Assessment & Plan:    Yeast dermatitis Given.  Erythematous rash with satellite lesions, likely secondary to yeast  dermatitis.  Patient uses diapers and given possible diarrhea reported by parents, patient likely developed diaper rash with overlying yeast infection.  Augmentin with 1-10% chance of causing diaper rash as well.  No improvement with A&E ointment or zinc oxide cream.  Will give trial of nystatin cream.  Have also instructed caretaker to apply barrier ointment such as Vaseline to protect skin from further damage as patient may continue to have diarrhea while on antibiotics.  Parents should contact GI specialist if wanting to change antibiotic course. -Nystatin cream twice daily until rash improves -Barrier ointment to protect skin (such as Vaseline) -Follow-up if rash she has no improvement or worsening    Return in about 2 weeks (around 01/05/2018) for if no improvement.   Oralia ManisSherin Lashanti Chambless, DO, PGY-1

## 2017-12-22 ENCOUNTER — Encounter: Payer: Self-pay | Admitting: Family Medicine

## 2017-12-22 ENCOUNTER — Ambulatory Visit (INDEPENDENT_AMBULATORY_CARE_PROVIDER_SITE_OTHER): Payer: Medicaid Other | Admitting: Family Medicine

## 2017-12-22 ENCOUNTER — Other Ambulatory Visit: Payer: Self-pay

## 2017-12-22 VITALS — BP 140/80 | Ht 67.0 in | Wt 186.0 lb

## 2017-12-22 DIAGNOSIS — B372 Candidiasis of skin and nail: Secondary | ICD-10-CM

## 2017-12-22 DIAGNOSIS — L22 Diaper dermatitis: Secondary | ICD-10-CM

## 2017-12-22 MED ORDER — NYSTATIN 100000 UNIT/GM EX CREA: 1.0000 "application " | TOPICAL_CREAM | Freq: Two times a day (BID) | CUTANEOUS | 1 refills | Status: DC

## 2017-12-22 NOTE — Patient Instructions (Signed)
  It was a pleasure seeing you today.   Today we discussed Bryon's diaper rash  For your diaper rash: I have prescribed an antifungal cream to be used twice a day. Please use this until the rash improves. Please also use a barrier cream (such as vaseline) to help prevent the area from being exposed to stool. If you see no improvement in 2 weeks, please follow up or call the clinic.   Please follow up in 2 weeks if no improvement or sooner if symptoms persist or worsen. Please call the clinic immediately if you have any concerns.   Our clinic's number is [phone]. Please call with questions or concerns.   Thank you,  Oralia ManisSherin Sheryle Vice, DO

## 2017-12-24 DIAGNOSIS — B372 Candidiasis of skin and nail: Secondary | ICD-10-CM | POA: Insufficient documentation

## 2017-12-24 NOTE — Assessment & Plan Note (Signed)
Given.  Erythematous rash with satellite lesions, likely secondary to yeast dermatitis.  Patient uses diapers and given possible diarrhea reported by parents, patient likely developed diaper rash with overlying yeast infection.  Augmentin with 1-10% chance of causing diaper rash as well.  No improvement with A&E ointment or zinc oxide cream.  Will give trial of nystatin cream.  Have also instructed caretaker to apply barrier ointment such as Vaseline to protect skin from further damage as patient may continue to have diarrhea while on antibiotics.  Parents should contact GI specialist if wanting to change antibiotic course. -Nystatin cream twice daily until rash improves -Barrier ointment to protect skin (such as Vaseline) -Follow-up if rash she has no improvement or worsening

## 2018-04-10 ENCOUNTER — Encounter: Payer: Self-pay | Admitting: Family Medicine

## 2018-04-15 ENCOUNTER — Other Ambulatory Visit (INDEPENDENT_AMBULATORY_CARE_PROVIDER_SITE_OTHER): Payer: Self-pay | Admitting: Family

## 2018-04-15 DIAGNOSIS — F419 Anxiety disorder, unspecified: Secondary | ICD-10-CM

## 2018-04-15 DIAGNOSIS — G4701 Insomnia due to medical condition: Secondary | ICD-10-CM

## 2018-05-01 ENCOUNTER — Other Ambulatory Visit (INDEPENDENT_AMBULATORY_CARE_PROVIDER_SITE_OTHER): Payer: Self-pay | Admitting: Family

## 2018-05-01 DIAGNOSIS — F840 Autistic disorder: Secondary | ICD-10-CM

## 2018-05-01 DIAGNOSIS — F419 Anxiety disorder, unspecified: Secondary | ICD-10-CM

## 2018-06-28 ENCOUNTER — Other Ambulatory Visit (INDEPENDENT_AMBULATORY_CARE_PROVIDER_SITE_OTHER): Payer: Self-pay | Admitting: Family

## 2018-06-28 DIAGNOSIS — G40309 Generalized idiopathic epilepsy and epileptic syndromes, not intractable, without status epilepticus: Secondary | ICD-10-CM

## 2018-06-29 ENCOUNTER — Other Ambulatory Visit (INDEPENDENT_AMBULATORY_CARE_PROVIDER_SITE_OTHER): Payer: Self-pay | Admitting: Family

## 2018-06-29 DIAGNOSIS — G40309 Generalized idiopathic epilepsy and epileptic syndromes, not intractable, without status epilepticus: Secondary | ICD-10-CM

## 2018-06-29 MED ORDER — LAMOTRIGINE 25 MG PO TBDP: ORAL_TABLET | ORAL | 5 refills | Status: DC

## 2018-06-29 NOTE — Telephone Encounter (Signed)
°  Who's calling (name and relationship to patient) : Weyman PedroJames Filley (Father)   Best contact number: [phone]  Provider they see: Elveria Risingina Goodpasture   Reason for call: Medication Refill, Patient is going to be out of his medication tomorrow     PRESCRIPTION REFILL ONLY  Name of prescription: Lemotrigine   Pharmacy: [facility] [address]

## 2018-06-29 NOTE — Telephone Encounter (Signed)
Rx has been electronically sent to the pharmacy 

## 2018-08-08 ENCOUNTER — Other Ambulatory Visit (INDEPENDENT_AMBULATORY_CARE_PROVIDER_SITE_OTHER): Payer: Self-pay | Admitting: Family

## 2018-08-08 DIAGNOSIS — F840 Autistic disorder: Secondary | ICD-10-CM

## 2018-08-08 DIAGNOSIS — G4720 Circadian rhythm sleep disorder, unspecified type: Secondary | ICD-10-CM

## 2018-08-08 DIAGNOSIS — F419 Anxiety disorder, unspecified: Secondary | ICD-10-CM

## 2018-08-13 ENCOUNTER — Encounter: Payer: Self-pay | Admitting: Family Medicine

## 2018-08-13 ENCOUNTER — Encounter (INDEPENDENT_AMBULATORY_CARE_PROVIDER_SITE_OTHER): Payer: Self-pay

## 2018-08-29 ENCOUNTER — Encounter (INDEPENDENT_AMBULATORY_CARE_PROVIDER_SITE_OTHER): Payer: Self-pay

## 2018-09-10 ENCOUNTER — Encounter (INDEPENDENT_AMBULATORY_CARE_PROVIDER_SITE_OTHER): Payer: Self-pay | Admitting: Family

## 2018-09-10 ENCOUNTER — Ambulatory Visit (INDEPENDENT_AMBULATORY_CARE_PROVIDER_SITE_OTHER): Payer: Medicaid Other | Admitting: Family

## 2018-09-10 VITALS — Ht 67.0 in | Wt 175.4 lb

## 2018-09-10 DIAGNOSIS — G4701 Insomnia due to medical condition: Secondary | ICD-10-CM | POA: Diagnosis not present

## 2018-09-10 DIAGNOSIS — F419 Anxiety disorder, unspecified: Secondary | ICD-10-CM

## 2018-09-10 DIAGNOSIS — F840 Autistic disorder: Secondary | ICD-10-CM | POA: Diagnosis not present

## 2018-09-10 DIAGNOSIS — G40309 Generalized idiopathic epilepsy and epileptic syndromes, not intractable, without status epilepticus: Secondary | ICD-10-CM | POA: Diagnosis not present

## 2018-09-10 DIAGNOSIS — G4720 Circadian rhythm sleep disorder, unspecified type: Secondary | ICD-10-CM

## 2018-09-10 MED ORDER — RISPERIDONE 1 MG/ML PO SOLN: ORAL | 5 refills | Status: DC

## 2018-09-10 MED ORDER — HYDROXYZINE HCL 10 MG/5ML PO SYRP: ORAL_SOLUTION | ORAL | 5 refills | Status: DC

## 2018-09-10 MED ORDER — DIAZEPAM 5 MG PO TABS: ORAL_TABLET | ORAL | 5 refills | Status: DC

## 2018-09-10 MED ORDER — LAMOTRIGINE 25 MG PO TBDP: ORAL_TABLET | ORAL | 5 refills | Status: DC

## 2018-09-10 MED ORDER — SERTRALINE HCL 20 MG/ML PO CONC: ORAL | 5 refills | Status: DC

## 2018-09-10 NOTE — Patient Instructions (Signed)
Thank you for coming in today.   Instructions for you until your next appointment are as follows: 1. Increase Sertraline to 1.4ml every morning 2. Continue Lamotrigine at the same dose.  3. If you need any forms completed for the AFL home, they can be faxed or dropped off for me to complete. Acer will not need to come in to the office for that.  4. Please plan to return for follow up in one year or sooner if needed.

## 2018-09-10 NOTE — Progress Notes (Signed)
Patient: Antonio Silva MRN: [id number] Sex: male DOB: Oct 06, 1995  Provider: [name], NP Location of Care: [facility]  Note type: Routine return visit  History of Present Illness: Referral Source: Everlene Other, DO History from: [facility] chart and Dad Chief Complaint: Anxiety, Medication Questions  Antonio Silva is a 23 y.o. young man with history of autism spectrum disorder with limited language, intellectual disability, 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 He was last seen October 16, 2017. [name] is taking and tolerating Lamotrigine, and has remained seizure free since July 2015. Dad has noted him staring a few times but believes it to be inattention, not seizure activity. [name] is cared for at home by his parents but will be moving to an AFL provider home in March when his father undergoes hip replacement surgery.  Dad is concerned that Nadir will have a difficult transition to that home. He has been spending some nights there to get used to it, and although he has done fairly well, his family has noted increased anxiety and restlessness. Dad asked if the Sertraline dose could increase slightly as he makes this transition. [name] has tolerated 20mg  of Sertraline in the past but had a seizure when 30mg  was administered.   [name] takes Hydroxyzine for insomnia, PRN Diazepam for anxiety and restless behavior, and Risperidone for agitation and agression. These medications have worked well for these problems.   [name] has been otherwise healthy since he was last seen. His father has no other health concerns for Antonio Silva today other than previously mentioned.  Review of Systems: Please see the HPI for neurologic and other pertinent review of systems. Otherwise, all other systems were reviewed and were negative.    Past Medical History:  Diagnosis Date  . Anxiety   . Autistic disorder, current or active state    . Chronic constipation   . GERD (gastroesophageal reflux disease)   . Seizures (HCC)    Hospitalizations: No., Head Injury: No., Nervous System Infections: No., Immunizations up to date: Yes.   Past Medical History Comments: See HPI Copied from previous record:  Birth History 6 lbs. 11 oz. infant born at full-term to a 59 year old primigravida.  Mother gained 30 pounds  Labor lasted for 10 hours  Normal spontaneous vaginal delivery  Delayed developmental milestones particularly for language.  Behavior History autism spectrum disorder, restless, agitated, self-injurious  Surgical History Past Surgical History:  Procedure Laterality Date  . FECAL DISIMPACTION  2018  . WISDOM TOOTH EXTRACTION     age 48    Family History family history includes Asthma in his mother; Breast cancer in his paternal grandmother; Depression in his mother; Diabetes in his mother; Heart attack in his maternal grandfather and maternal grandmother; Hyperlipidemia in his mother; Hypertension in his mother; Ulcerative colitis in his father. Family History is otherwise negative for migraines, seizures, cognitive impairment, blindness, deafness, birth defects, chromosomal disorder, autism.  Social History Social History   Socioeconomic History  . Marital status: Single    Spouse name: Not on file  . Number of children: Not on file  . Years of education: Not on file  . Highest education level: Not on file  Occupational History  . Not on file  Social Needs  . Financial resource strain: Not on file  . Food insecurity:    Worry: Not on file    Inability: Not on file  . Transportation needs:    Medical: Not on file  Non-medical: Not on file  Tobacco Use  . Smoking status: Never Smoker  . Smokeless tobacco: Never Used  Substance and Sexual Activity  . Alcohol use: No  . Drug use: No  . Sexual activity: Never  Lifestyle  . Physical activity:    Days per week: Not on file    Minutes per  session: Not on file  . Stress: Not on file  Relationships  . Social connections:    Talks on phone: Not on file    Gets together: Not on file    Attends religious service: Not on file    Active member of club or organization: Not on file    Attends meetings of clubs or organizations: Not on file    Relationship status: Not on file  Other Topics Concern  . Not on file  Social History Narrative   Antonio Silva is 23 yo male that attends [employer]   He is in a day program and is doing well.    He lives with both parents and has no siblings.    He enjoys attending his day program and walking    Allergies Allergies  Allergen Reactions  . Ketamine Other (See Comments)    CAUSES SEIZURES   . Other     Lactose    Physical Exam Ht 5\' 7"  (1.702 m) Comment: dad reported  Wt 175 lb 6.4 oz (79.6 kg)   BMI 27.47 kg/m  He refused to cooperate for heart rate, respirations or BP measurements. General: well developed, well nourished young man, pacing in exam room, in no evident distress; brown hair, blue eyes, right handed Head: normocephalic and atraumatic. No dysmorphic features. Refused examination of oropharynx Neck: supple Cardiovascular: regular rate and rhythm, no murmurs. Respiratory: Clear to auscultation bilaterally Abdomen: Refused examination Musculoskeletal: No skeletal deformities or obvious scoliosis.  Skin: no rashes or neurocutaneous lesions  Neurologic Exam Mental Status: Awake and fully alert. Pacing during the entire visit, occasionally going to the door and attempting to leave. Has very limited language. I heard him say "bye" as he left.  Does not smile responsively. Unable to follow commands. Resistant to invasions in to his space Cranial Nerves: Fundoscopic exam - red reflex present.  Unable to fully visualize fundus.  Pupils equal briskly reactive to light.  Turns to localize faces, objects and sounds in the periphery.  Facial movements are symmetric Motor:  Normal functional bulk, tone and strength Sensory: Withdrawal x 4 Coordination: Unable to adequately assess due to patient's inability to participate in examination. No dysmetria when reaching for objects. Gait and Station: Stance is normal. Gait has normal stride length and balance. Unable to follow instructions for heel, toe or tandem walk. Reflexes: Unable to assess due to his inability to cooperate  Impression 1.  Autism spectrum disorder with accompanying intellectual impairment, requiring very substantial support 2.  Generalized convulsive epilepsy 3.  Mixed receptive/ex[ressive language disorder 4.  Circadian rhythm sleep disorder 5.  Restlessness and anxiety   Recommendations for plan of care The patient's previous [facility] records were reviewed. Ausitn has neither had nor required imaging or lab studies since the last visit. He is a 23 year old young woman with autism, intellectual disability, insomnia, restlessness and anxiety, and generalized convulsive epilepsy. Loranzo is displaying increased anxiety and restless behavior as he is making the transition to an AFL provider home. I recommended to Dad that the Sertraline dose increase to 1 ml (20mg ) each morning. He will continue his other medications  without change. I talked to Dad about the transition to the AFL provider home. I will be happy to complete forms if needed for this transition. I will see Woodrow back in follow up in 1 year or sooner if needed. Dad agreed with the plans made today.  The medication list was reviewed and reconciled. I reviewed changes that were made in the prescribed medications today.  A complete medication list was provided to the patient's father.  Allergies as of 09/10/2018      Reactions   Ketamine Other (See Comments)   CAUSES SEIZURES    Other    Lactose      Medication List       Accurate as of September 10, 2018  1:45 PM. Always use your most recent med list.        BIOGAIA PROBIOTIC  Liqd Take 5 mLs by mouth daily at 8 pm.   diazepam 5 MG tablet Commonly known as:  VALIUM TAKE 1 TABLET EVERY 6 HOURS AS NEEDED FOR ANXIETY   hydrOXYzine 10 MG/5ML syrup Commonly known as:  ATARAX TAKE 25 MLS BY MOUTH EVERY NIGHT AT BEDTIME   lamotrigine 25 MG disintegrating tablet Commonly known as:  LAMICTAL TAKE 4 TABLETS TCyLO:Budd Palmer multivitamin Liqd Take 5 mLs by mouth [id number].Janee E MORNING       Total time spent with the patient was 20 minutes, of which 50% or more was spent in counseling and coordination of care.   Choice Kleinsasser NP-C

## 2018-09-17 ENCOUNTER — Encounter: Payer: Self-pay | Admitting: Family Medicine

## 2018-09-20 ENCOUNTER — Encounter: Payer: Self-pay | Admitting: Family Medicine

## 2018-10-01 ENCOUNTER — Ambulatory Visit (INDEPENDENT_AMBULATORY_CARE_PROVIDER_SITE_OTHER): Payer: Medicaid Other | Admitting: Family Medicine

## 2018-10-01 ENCOUNTER — Other Ambulatory Visit: Payer: Self-pay

## 2018-10-01 VITALS — Wt 166.1 lb

## 2018-10-01 DIAGNOSIS — H0011 Chalazion right upper eyelid: Secondary | ICD-10-CM | POA: Diagnosis present

## 2018-10-01 NOTE — Assessment & Plan Note (Signed)
Patient presents with right upper eyelid nodule less than 1 cm in size.  No erythema or drainage.  Lesion does not appear to be associated with any pain or visual changes.  Appearance was most consistent with chalazion.  No signs of infection concerning for possible stye/hordeolum.  Recommended warm compresses and close monitoring.  Should self resolve.  However patient has had constant rubbing which may be irritating.  Follow-up on a as needed basis if not improving the next few days.

## 2018-10-01 NOTE — Progress Notes (Signed)
   Subjective:    Patient ID: Antonio Silva, male    DOB: 03/02/1996, 23 y.o.   MRN: [id number]   CC: Right eye stye  HPI: Patient is a 23 year old male past medical history significant for autism presents today with mom and caretaker for her right eye stye.  Patient is nonverbal. Mother reports that they have noticed the nodule few days ago.  Patient has been rubbing his right upper eyelid for the past few days.  Nodule has been small and mildly erythematous.  Does not appear to be associated with pain based on patient's presentation and caretakers report.  No prior history.  They have denied any drainage or swelling associated with upper eyelid.  No fevers, nausea, vomiting or other signs of systemic infection.  Smoking status reviewed   ROS: all other systems were reviewed and are negative other than in the HPI   Past Medical History:  Diagnosis Date  . Anxiety   . Autistic disorder, current or active state   . Chronic constipation   . GERD (gastroesophageal reflux disease)   . Seizures (HCC)     Past Surgical History:  Procedure Laterality Date  . FECAL DISIMPACTION  2018  . WISDOM TOOTH EXTRACTION     age 58    Past medical history, surgical, family, and social history reviewed and updated in the EMR as appropriate.  Objective:  Wt 166 lb 2 oz (75.4 kg)   BMI 26.02 kg/m   Vitals and nursing note reviewed    General: NAD, pleasant, unable  to participate in exam HEENT: Small nodule noted on upper eyelid.  No erythema or drainage noted.  Mild crusting seen on eyelashes.  Left eye exam within normal limit difficult to assess visual acuity given that patient is nonverbal and does not follow commands. Cardiac: RRR, normal heart sounds, no murmurs. 2+ radial and PT pulses bilaterally Respiratory: CTAB, normal effort, No wheezes, rales or rhonchi Abdomen: soft, nontender, nondistended, no hepatic or splenomegaly, +BS Extremities: no edema or cyanosis. WWP. Skin: warm  and dry, no rashes noted Neuro: alert and oriented x4, no focal deficits Psych: Normal affect and mood   Assessment & Plan:   Chalazion of right upper eyelid Patient presents with right upper eyelid nodule less than 1 cm in size.  No erythema or drainage.  Lesion does not appear to be associated with any pain or visual changes.  Appearance was most consistent with chalazion.  No signs of infection concerning for possible stye/hordeolum.  Recommended warm compresses and close monitoring.  Should self resolve.  However patient has had constant rubbing which may be irritating.  Follow-up on a as needed basis if not improving the next few days.   Lovena Neighbours, MD [facility] PGY-3

## 2018-10-30 ENCOUNTER — Telehealth (INDEPENDENT_AMBULATORY_CARE_PROVIDER_SITE_OTHER): Payer: Self-pay | Admitting: Family

## 2018-10-30 DIAGNOSIS — G40309 Generalized idiopathic epilepsy and epileptic syndromes, not intractable, without status epilepticus: Secondary | ICD-10-CM

## 2018-10-30 MED ORDER — LAMOTRIGINE 25 MG PO TBDP
ORAL_TABLET | ORAL | 5 refills | Status: DC
Start: 2018-10-30 — End: 2019-05-20

## 2018-10-30 NOTE — Telephone Encounter (Signed)
Let me know if you need some help.

## 2018-10-30 NOTE — Telephone Encounter (Signed)
°  Who's calling (name and relationship to patient) : [name] Herbalist)  Best contact number: [phone] or [phone] Provider they see: Inetta Fermo Reason for call: Father stated that pt had a seizure last night. Dad would like a return call from [place].

## 2018-10-30 NOTE — Telephone Encounter (Signed)
I called and spoke with both Mom and Dad. They said that Jaquarius had moved into an [facility] provider home about 1 month ago and seemed to be doing ok other than being restless because his day program ended due to Covid 19 restrictions. Last night the [facility] provider called parents to notify them that Kaelib had a 5 minute seizure. EMS was called and assessed him. He was not injured and not transported to the hospital. I recommended increasing Lamotrigine to 5 tablets BID and his parents agreed. They asked me to fax an order for that and for Vitamin B12 supplements (OTC) that he has been taking to [facility], which I will do. I updated his Rx at the pharmacy. I asked his parents to call me if Marky has more seizures. TG

## 2018-11-20 ENCOUNTER — Other Ambulatory Visit: Payer: Self-pay

## 2018-11-20 MED ORDER — POLYETHYLENE GLYCOL 3350 17 GM/SCOOP PO POWD: 17.0000 g | Freq: Three times a day (TID) | ORAL | 11 refills | Status: DC

## 2018-11-20 NOTE — Telephone Encounter (Signed)
Rx faxed from [facility] [address] for Gavilax Powder, Take 17 G by moth 3 times daily. Did not see on med list. Does have Glycolax/Miralax on med list. Sunday Spillers, CMA

## 2018-11-21 ENCOUNTER — Encounter: Payer: Self-pay | Admitting: Family Medicine

## 2018-11-21 ENCOUNTER — Other Ambulatory Visit: Payer: Self-pay | Admitting: Family Medicine

## 2019-02-04 ENCOUNTER — Other Ambulatory Visit (INDEPENDENT_AMBULATORY_CARE_PROVIDER_SITE_OTHER): Payer: Self-pay | Admitting: Family

## 2019-02-04 DIAGNOSIS — F419 Anxiety disorder, unspecified: Secondary | ICD-10-CM

## 2019-02-22 ENCOUNTER — Other Ambulatory Visit (INDEPENDENT_AMBULATORY_CARE_PROVIDER_SITE_OTHER): Payer: Self-pay | Admitting: Family

## 2019-02-22 DIAGNOSIS — F419 Anxiety disorder, unspecified: Secondary | ICD-10-CM

## 2019-05-01 ENCOUNTER — Encounter (INDEPENDENT_AMBULATORY_CARE_PROVIDER_SITE_OTHER): Payer: Self-pay

## 2019-05-01 ENCOUNTER — Other Ambulatory Visit (INDEPENDENT_AMBULATORY_CARE_PROVIDER_SITE_OTHER): Payer: Self-pay | Admitting: Family

## 2019-05-01 DIAGNOSIS — F419 Anxiety disorder, unspecified: Secondary | ICD-10-CM

## 2019-05-01 DIAGNOSIS — F840 Autistic disorder: Secondary | ICD-10-CM

## 2019-05-01 MED ORDER — DIAZEPAM 5 MG PO TABS
ORAL_TABLET | ORAL | 5 refills | Status: DC
Start: 2019-05-01 — End: 2020-01-22

## 2019-05-16 ENCOUNTER — Encounter (INDEPENDENT_AMBULATORY_CARE_PROVIDER_SITE_OTHER): Payer: Self-pay

## 2019-05-20 ENCOUNTER — Other Ambulatory Visit (INDEPENDENT_AMBULATORY_CARE_PROVIDER_SITE_OTHER): Payer: Self-pay | Admitting: Family

## 2019-05-20 ENCOUNTER — Encounter: Payer: Self-pay | Admitting: Family Medicine

## 2019-05-20 DIAGNOSIS — G40309 Generalized idiopathic epilepsy and epileptic syndromes, not intractable, without status epilepticus: Secondary | ICD-10-CM

## 2019-05-22 ENCOUNTER — Encounter (INDEPENDENT_AMBULATORY_CARE_PROVIDER_SITE_OTHER): Payer: Self-pay | Admitting: Family

## 2019-05-22 ENCOUNTER — Ambulatory Visit (INDEPENDENT_AMBULATORY_CARE_PROVIDER_SITE_OTHER): Payer: Medicaid Other | Admitting: Family

## 2019-05-22 ENCOUNTER — Ambulatory Visit (INDEPENDENT_AMBULATORY_CARE_PROVIDER_SITE_OTHER): Payer: Medicaid Other

## 2019-05-22 ENCOUNTER — Other Ambulatory Visit: Payer: Self-pay

## 2019-05-22 DIAGNOSIS — G40309 Generalized idiopathic epilepsy and epileptic syndromes, not intractable, without status epilepticus: Secondary | ICD-10-CM | POA: Diagnosis not present

## 2019-05-22 DIAGNOSIS — F840 Autistic disorder: Secondary | ICD-10-CM

## 2019-05-22 DIAGNOSIS — F802 Mixed receptive-expressive language disorder: Secondary | ICD-10-CM | POA: Diagnosis not present

## 2019-05-22 DIAGNOSIS — F419 Anxiety disorder, unspecified: Secondary | ICD-10-CM

## 2019-05-22 DIAGNOSIS — Z23 Encounter for immunization: Secondary | ICD-10-CM | POA: Diagnosis present

## 2019-05-22 DIAGNOSIS — G4720 Circadian rhythm sleep disorder, unspecified type: Secondary | ICD-10-CM

## 2019-05-22 NOTE — Progress Notes (Signed)
This is a Pediatric Specialist E-Visit follow up consult provided via [place] and their parent/guardian Wilburn Keir consented to an E-Visit consult today.  Location of patient: Antonio Silva is at home Location of provider: Rockwell Germany, NP is in office Patient was referred by Caroline More, DO   The following participants were involved in this E-Visit: mom, patient, CMA, provider  Chief Complain/ Reason for E-Visit today: Autism spectrum disorder/anxiety/Epilepsy Total time on call: 20 min Follow up: 1 year     Antonio Silva   MRN:  [id number]  October 24, 1995   Provider: Rockwell Germany NP-C Location of Care: [facility]  Visit type: Telehealth visit  Last visit: 10/16/2017  Referral source: Thersa Salt, DO History from: mother and chcn chart  Brief history:  Copied from previous record: History of autism spectrum disorder with limited language, intellectual disability, 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 He is taking and tolerating Lamotrigine and has remained seizure free since April 2020 when he had a seizure in the setting of missed medication. He has ongoing anxiety that is treated with Sertraline. He also takes Hydroxyzine for insomnia, Diazepam as needed for restless behavior and Risperidone for agitation and aggressive behavior.   Today's concerns:  Mom reports today that Antonio Silva has been doing well since the seizure in April. At that time he had moved into an [facility] provider home, was very unhappy there and had missed several doses of medication. He returned home to the care of his parents and has done well since then. Mom doesn't feel that the Sertraline is helpful for anxiety but admits that he is on a very low dose. She is fearful of increasing the dose because he had a seizure on 30mg  in the past. Mom is interested in trying some alternative medicine but says that she has been unable  to find a provider that accepts Medicaid. Mom notes that Antonio Silva has workers that help to care for him each day and that they and his parents are usually able to redirect him when he is restless or agitated. She worries when he bangs his head, fearing that he will injure his brain.   Mom says that Antonio Silva continues to have problems with constipation and abdominal pain. He has been evaluated by a gastroenterologist and has been told that he has an elongated colon. Mom says that Antonio Silva has been otherwise generally healthy and has no other health concerns for him today other than previously mentioned.   Review of systems: Please see HPI for neurologic and other pertinent review of systems. Otherwise all other systems were reviewed and were negative.  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 11 oz. infant born at full-term to a 74 year old primigravida.  Mother gained 30 pounds  Labor lasted for 10 hours  Normal spontaneous vaginal delivery  Delayed  developmental milestones particularly for language.  Behavior History autism spectrum disorder, restless, agitated, self-injurious   Surgical history: Past Surgical History:  Procedure Laterality Date   FECAL DISIMPACTION  2018   WISDOM TOOTH EXTRACTION     age 77     Family history: family history includes Asthma in his mother; Breast cancer in his paternal grandmother; Depression in his mother; Diabetes in his mother; Heart attack in his maternal grandfather and maternal grandmother; Hyperlipidemia in his mother; Hypertension in his mother; Ulcerative colitis in his father.   Social history: Social History   Socioeconomic History   Marital status: Single    Spouse name: Not on file   Number of children: Not on file   Years of education: Not on file   Highest education level: Not on file  Occupational History   Not on file  Social Needs   Financial resource strain: Not on file   Food insecurity    Worry: Not on file    Inability: Not on file   Transportation needs    Medical: Not on file    Non-medical: Not on file  Tobacco Use   Smoking status: Never Smoker   Smokeless tobacco: Never Used  Substance and Sexual Activity   Alcohol use: No   Drug use: No   Sexual activity: Never  Lifestyle   Physical activity    Days per week: Not on file    Minutes per session: Not on file   Stress: Not on file  Relationships   Social connections    Talks on phone: Not on file    Gets together: Not on file    Attends religious service: Not on file    Active member of club or organization: Not on file    Attends meetings of clubs or organizations: Not on file    Relationship status: Not on file   Intimate partner violence    Fear of current or ex partner: Not on file    Emotionally abused: Not on file    Physically abused: Not on file    Forced sexual activity: Not on file  Other Topics Concern   Not on file  Social History Narrative   Winton is 23  yo male that attends [employer]   He is in a day program and is doing well.    He lives with both parents and has no siblings.    He enjoys attending his day program and walking    Past/failed meds:   Allergies: Allergies  Allergen Reactions   Ketamine Other (See Comments)    CAUSES SEIZURES    Other     Lactose      Immunizations: Immunization History  Administered Date(s) Administered   Influenza,inj,Quad PF,6+ Mos 05/28/2014, 05/18/2015, 05/19/2016   Tdap 11/21/2017      Diagnostics/Screenings:   Physical Exam: There were no vitals taken for this visit.  There was no examination as this was a phone visit.  Impression: 1. Autism spectrum disorder with accompanying intellectual impairment, requiring very substantial support 2. Generalized convulsive epilepsy 3. Mixed receptive/expressive language disorder 4. Circadian rhythm sleep disorder 5. Restlessness and anxiety  Recommendations for plan of care: The patient's previous [facility] records were reviewed. Antonio Silva  has neither had nor required imaging or lab studies since the last visit. He is a 23 year old young man with history of autism spectrum disorder, mixed receptive/expressive language disorder, intellectual impairment, insomnia, generalized convulsive epilepsy, as well as restlessness, anxiety and aggressive behavior. He has remained seizure free on Lamotrigine since April 2020 when he had a seizure in the setting of missed medication. We talked about his behavior and I recommended to Mom that she try to slowly increase the Sertraline dose up to 25mg  to see if it helps with his anxiety. We could also consider increasing the Risperidone dose but I would recommend only changing one drug at a time. I will see Antonio NottinghamJamison back in follow up in 1 year or sooner if needed. Mom agreed with the plans made today.   The medication list was reviewed and reconciled. No changes were made in the prescribed medications  today. A complete medication list was provided to the patient.  Allergies as of 05/22/2019      Reactions   Ketamine Other (See Comments)   CAUSES SEIZURES    Other    Lactose      Medication List       Accurate as of May 22, 2019 10:30 AM. If you have any questions, ask your nurse or doctor.        BioGaia Probiotic Liqd Take 5 mLs by mouth daily at 8 pm.   diazepam 5 MG tablet Commonly known as: VALIUM TAKE 1 TABLET EVERY 6 HOURS AS NEEDED FOR ANXIETY   hydrOXYzine 10 MG/5ML syrup Commonly known as: ATARAX TAKE 25 MLS BY MOUTH EVERY NIGHT AT BEDTIME   lamotrigine 25 MG disintegrating tablet Commonly known as: LAMICTAL CHEW 5 TABLETS BY MOUTH TWICE DAILY   multivitamin Liqd Take 5 mLs by mouth daily.   nystatin cream Commonly known as: MYCOSTATIN Apply 1 application topically 2 (two) times daily. Apply to diaper area twice a day   omeprazole 20 MG capsule Commonly known as: PRILOSEC TAKE 1 CAPSULE BY MOUTH TWICE A DAY BEFORE A MEAL, CAN OPEN AND PLACE IN FOOD OR DRINK   polyethylene glycol powder 17 GM/SCOOP powder Commonly known as: GLYCOLAX/MIRALAX Take 17 g by mouth 3 (three) times daily.   risperiDONE 1 MG/ML oral solution Commonly known as: RISPERDAL TAKE 2ML TWICE A DAY   sertraline 20 MG/ML concentrated solution Commonly known as: ZOLOFT TAKE 1 ML IN THE MORNING       Total time spent on the phone with the patient's mother was 20 minutes, of which 50% or more was spent in counseling and coordination of care.  Elveria Risingina Ayse Mccartin NP-C [facility] Ph. [phone] Fax [phone]

## 2019-05-22 NOTE — Progress Notes (Signed)
Patient presents in nurse clinic for flu vaccine. Vaccine given RD, site unremarkable.  

## 2019-05-23 ENCOUNTER — Encounter (INDEPENDENT_AMBULATORY_CARE_PROVIDER_SITE_OTHER): Payer: Self-pay | Admitting: Family

## 2019-05-23 NOTE — Patient Instructions (Signed)
Thank you for meeting with me by phone today.   Instructions for you until your next appointment are as follows: 1. Continue Antonio Silva's medications as you are giving them. Please let me know if he has any seizures.  2. Try increasing the Sertraline very slowly as we discussed today.  We can also consider increasing the Risperidone if he becomes more agitated or aggressive.  3. Please sign up for MyChart if you have not done so 4. Please plan to return for follow up in one year or sooner if needed.

## 2019-06-20 ENCOUNTER — Other Ambulatory Visit (INDEPENDENT_AMBULATORY_CARE_PROVIDER_SITE_OTHER): Payer: Self-pay | Admitting: Family

## 2019-06-20 DIAGNOSIS — G4720 Circadian rhythm sleep disorder, unspecified type: Secondary | ICD-10-CM

## 2019-06-20 DIAGNOSIS — F840 Autistic disorder: Secondary | ICD-10-CM

## 2019-06-20 DIAGNOSIS — F419 Anxiety disorder, unspecified: Secondary | ICD-10-CM

## 2019-06-21 NOTE — Telephone Encounter (Signed)
Too early

## 2019-06-26 ENCOUNTER — Encounter (INDEPENDENT_AMBULATORY_CARE_PROVIDER_SITE_OTHER): Payer: Self-pay

## 2019-06-26 DIAGNOSIS — F419 Anxiety disorder, unspecified: Secondary | ICD-10-CM

## 2019-06-26 MED ORDER — SERTRALINE HCL 20 MG/ML PO CONC
ORAL | 5 refills | Status: DC
Start: 2019-06-26 — End: 2019-12-10

## 2019-09-12 ENCOUNTER — Other Ambulatory Visit (INDEPENDENT_AMBULATORY_CARE_PROVIDER_SITE_OTHER): Payer: Self-pay | Admitting: Family

## 2019-09-12 DIAGNOSIS — G4701 Insomnia due to medical condition: Secondary | ICD-10-CM

## 2019-09-12 DIAGNOSIS — F419 Anxiety disorder, unspecified: Secondary | ICD-10-CM

## 2019-09-23 ENCOUNTER — Ambulatory Visit: Payer: Self-pay

## 2019-09-30 ENCOUNTER — Other Ambulatory Visit: Payer: Self-pay

## 2019-09-30 ENCOUNTER — Encounter (INDEPENDENT_AMBULATORY_CARE_PROVIDER_SITE_OTHER): Payer: Self-pay | Admitting: Family

## 2019-09-30 ENCOUNTER — Telehealth (INDEPENDENT_AMBULATORY_CARE_PROVIDER_SITE_OTHER): Payer: Medicaid Other | Admitting: Family

## 2019-09-30 VITALS — Wt 170.0 lb

## 2019-09-30 DIAGNOSIS — G40309 Generalized idiopathic epilepsy and epileptic syndromes, not intractable, without status epilepticus: Secondary | ICD-10-CM | POA: Diagnosis not present

## 2019-09-30 DIAGNOSIS — F419 Anxiety disorder, unspecified: Secondary | ICD-10-CM

## 2019-09-30 DIAGNOSIS — G4720 Circadian rhythm sleep disorder, unspecified type: Secondary | ICD-10-CM | POA: Diagnosis not present

## 2019-09-30 DIAGNOSIS — F802 Mixed receptive-expressive language disorder: Secondary | ICD-10-CM | POA: Diagnosis not present

## 2019-09-30 DIAGNOSIS — F840 Autistic disorder: Secondary | ICD-10-CM

## 2019-09-30 NOTE — Progress Notes (Signed)
This is a Pediatric Specialist E-Visit follow up consult provided via [facility] Antonio Silva and their parent/guardian Antonio Silva consented to an E-Visit consult today.  Location of patient: Antonio Silva is at home Location of provider: [name], NP is in office Patient was referred by Antonio Manis, DO   The following participants were involved in this E-Visit: patient, mother, CMA, provider  Chief Complain/ Reason for E-Visit today: Autism/Epilepsy Total time on call: 15 min Follow up: 6 months     Antonio Silva   MRN:  [id number]  02/04/1996   Provider: [name] NP-C Location of Care: [facility]  Visit type: Telehealth visit  Last visit: 05/22/2019  Referral source: Everlene Other, DO  History from: mother and chcn chart  Brief history:  Copied from previous record: History of autism spectrum disorder with limited language, intellectual disability, 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 He is taking and tolerating Lamotrigine and has remained seizure free since April 2020 when he had a seizure in the setting of missed medication. He has ongoing anxiety that is treated with Sertraline. He also takes Hydroxyzine for insomnia, Diazepam as needed for restless behavior and Risperidone for agitation and aggressive behavior.   Today's concerns:  When Antonio Silva was last seen in November 2020, Mom reported increase in anxiety and the Sertraline dose was increased slightly. Mom reports today that he tolerated the increase and has been doing better with anxiety and restless behavior. He has remained seizure. Mom says that he gets restless at times because he doesn't get to do the same activities that he did prior to Covid pandemic, but that he can usually be re-directed. He also has ongoing problems with stomach upset and constipation, and Mom notes that when he appears to have stomach pain that his behavior  is significantly worse. Antonio Silva has been otherwise generally healthy since he was last seen. Mom has no other health concerns for him today other than previously mentioned.   Review of systems: Please see HPI for neurologic and other pertinent review of systems. Otherwise all other systems were reviewed and were negative.  Problem List: Patient Active Problem List   Diagnosis Date Noted  . Chalazion of right upper eyelid 10/01/2018  . Yeast dermatitis 12/24/2017  . Impaction of colon (HCC) 06/03/2016  . Mixed receptive-expressive language disorder 03/11/2015  . 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 Otitis externa 01/22/2014  . Autism spectrum disorder with accompanying intellectual impairment, requiring very substantial support (level 3) 01/07/2014  . Anxiety 12/31/2013  . GERD (gastroesophageal reflux disease) 12/31/2013  . Chronic constipation 12/31/2013  . 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 Healthcare maintenance 01/07/2013  . Autistic disorder, current or active state 01/07/2013     Past Medical History:  Diagnosis Date  . Anxiety   . Autistic disorder, current or active state   . Chronic constipation   . GERD (gastroesophageal reflux disease)   . Seizures (HCC)     Past medical history comments: See HPI Copied from previous record:  Birth History 6 lbs. 11 oz. infant born at full-term to a 25 year old primigravida.  Mother gained 30 pounds  Labor lasted for 10 hours  Normal spontaneous vaginal delivery  Delayed developmental milestones particularly for language.  Behavior History autism spectrum disorder, restless, agitated, self-injurious  Surgical history: Past Surgical History:  Procedure Laterality Date  . FECAL DISIMPACTION  2018  . WISDOM TOOTH EXTRACTION     age 24  Family history: family history includes Asthma in his mother; Breast cancer in his paternal grandmother; Depression in his mother; Diabetes in his mother;  Heart attack in his maternal grandfather and maternal grandmother; Hyperlipidemia in his mother; Hypertension in his mother; Ulcerative colitis in his father.   Social history: Social History   Socioeconomic History  . Marital status: Single    Spouse name: Not on file  . Number of children: Not on file  . Years of education: Not on file  . Highest education level: Not on file  Occupational History  . Not on file  Tobacco Use  . Smoking status: Never Smoker  . Smokeless tobacco: Never Used  Substance and Sexual Activity  . Alcohol use: No  . Drug use: No  . Sexual activity: Never  Other Topics Concern  . Not on file  Social History Narrative   Link is 24 yo male that attends [employer]   He is in a day program and is doing well.    He lives with both parents and has no siblings.    He enjoys attending his day program and walking   Social Determinants of Health   Financial Resource Strain:   . Difficulty of Paying Living Expenses:   Food Insecurity:   . Worried About Charity fundraiser in the Last Year:   . Arboriculturist in the Last Year:   Transportation Needs:   . Film/video editor (Medical):   [name] Lack of Transportation (Non-Medical):   Physical Activity:   . Days of Exercise per Week:   . Minutes of Exercise per Session:   Stress:   . Feeling of Stress :   Social Connections:   . Frequency of Communication with Friends and Family:   . Frequency of Social Gatherings with Friends and Family:   . Attends Religious Services:   . Active Member of Clubs or Organizations:   . Attends Archivist Meetings:   [name] Marital Status:   Intimate Partner Violence:   . Fear of Current or Ex-Partner:   . Emotionally Abused:   [name] Physically Abused:   . Sexually Abused:      Past/failed meds:   Allergies: Allergies  Allergen Reactions  . Ketamine Other (See Comments)    CAUSES SEIZURES   . Other     Lactose       Immunizations: Immunization History  Administered Date(s) Administered  . Influenza,inj,Quad PF,6+ Mos 05/28/2014, 05/18/2015, 05/19/2016, 05/22/2019  . Tdap 11/21/2017     Diagnostics/Screenings:  Physical Exam: There were no vitals taken for this visit.  There was no examination as the patient was unable to participate in examination due to his intellectual disability  Impression: 1. Autism spectrum disorder with accompanying intellectual disability requiring substantial support 2. Generalized convulsive epilepsy 3. Mixed receptive/expressive language disorder 4. Circadian rhythm sleep disorder 5. Restlessness and anxiety  Recommendations for plan of care: The patient's previous [facility] records were reviewed. Roselyn Reef has neither had nor required imaging or lab studies since the last visit. He is a 24 year old man with history of autism spectrum disorder with accompanying intellectual disability requiring substantial support, generalized convulsive epilepsy, mixed receptive/expressive language disorder, sleep initiation disorder and anxiety with restless behavior. He is taking and tolerating Lamotrigine and has remained seizure free since April 2020. He is taking Sertraline for anxiety and has experienced improvement in anxiety and restless behavior. He has Diazepam when needed for restlessness and takes Risperidone for  agitation and aggression. Mom does well to redirect Antonio Silva and manage his behaviors. I will see him back in follow up in 6 months or sooner if needed. Mom agreed with the plans made today.   The medication list was reviewed and reconciled. No changes were made in the prescribed medications today. A complete medication list was provided to the patient.  Allergies as of 09/30/2019      Reactions   Ketamine Other (See Comments)   CAUSES SEIZURES    Other    Lactose      Medication List       Accurate as of September 30, 2019 11:55 AM. If you have any questions, ask your  nurse or doctor.        BioGaia Probiotic Liqd Take 5 mLs by mouth daily at 8 pm.   diazepam 5 MG tablet Commonly known as: VALIUM TAKE 1 TABLET EVERY 6 HOURS AS NEEDED FOR ANXIETY   hydrOXYzine 10 MG/5ML syrup Commonly known as: ATARAX TAKE 25 MLS BY MOUTH EVERY NIGHT AT BEDTIME   lamotrigine 25 MG disintegrating tablet Commonly known as: LAMICTAL CHEW 5 TABLETS BY MOUTH TWICE DAILY   multivitamin Liqd Take 5 mLs by mouth daily.   nystatin cream Commonly known as: MYCOSTATIN Apply 1 application topically 2 (two) times daily. Apply to diaper area twice a day   omeprazole 20 MG capsule Commonly known as: PRILOSEC TAKE 1 CAPSULE BY MOUTH TWICE A DAY BEFORE A MEAL, CAN OPEN AND PLACE IN FOOD OR DRINK   polyethylene glycol powder 17 GM/SCOOP powder Commonly known as: GLYCOLAX/MIRALAX Take 17 g by mouth 3 (three) times daily.   risperiDONE 1 MG/ML oral solution Commonly known as: RISPERDAL TAKE TWICE A DAY   sertraline 20 MG/ML concentrated solution Commonly known as: ZOLOFT TAKE 1.5 ML IN THE MORNING       Total time spent on the video with the patient's mother was 15 minutes, of which 50% or more was spent in counseling and coordination of care.  Antonio Rising NP-C [facility] Ph. [phone] Fax [phone]

## 2019-09-30 NOTE — Patient Instructions (Signed)
Thank you for meeting with me by video today.   Instructions for you until your next appointment are as follows: 1. Continue giving Antonio Silva's medications as you have been doing 2. Let me know if he has any seizures or if you have any concerns 3. Please sign up for MyChart if you have not done so 4. Please plan to return for follow up in 6 months or sooner if needed.

## 2019-10-28 ENCOUNTER — Other Ambulatory Visit: Payer: Self-pay | Admitting: Family Medicine

## 2019-11-25 ENCOUNTER — Encounter: Payer: Self-pay | Admitting: Family Medicine

## 2019-12-01 ENCOUNTER — Other Ambulatory Visit (INDEPENDENT_AMBULATORY_CARE_PROVIDER_SITE_OTHER): Payer: Self-pay | Admitting: Family

## 2019-12-01 DIAGNOSIS — G40309 Generalized idiopathic epilepsy and epileptic syndromes, not intractable, without status epilepticus: Secondary | ICD-10-CM

## 2019-12-10 ENCOUNTER — Other Ambulatory Visit (INDEPENDENT_AMBULATORY_CARE_PROVIDER_SITE_OTHER): Payer: Self-pay | Admitting: Family

## 2019-12-10 DIAGNOSIS — F419 Anxiety disorder, unspecified: Secondary | ICD-10-CM

## 2019-12-24 ENCOUNTER — Encounter: Payer: Self-pay | Admitting: Family Medicine

## 2020-01-21 ENCOUNTER — Other Ambulatory Visit (INDEPENDENT_AMBULATORY_CARE_PROVIDER_SITE_OTHER): Payer: Self-pay | Admitting: Family

## 2020-01-21 ENCOUNTER — Encounter (INDEPENDENT_AMBULATORY_CARE_PROVIDER_SITE_OTHER): Payer: Self-pay

## 2020-01-21 DIAGNOSIS — G4720 Circadian rhythm sleep disorder, unspecified type: Secondary | ICD-10-CM

## 2020-01-21 DIAGNOSIS — F840 Autistic disorder: Secondary | ICD-10-CM

## 2020-01-21 DIAGNOSIS — F419 Anxiety disorder, unspecified: Secondary | ICD-10-CM

## 2020-01-22 NOTE — Telephone Encounter (Signed)
Please send to the pharmacy °

## 2020-02-07 ENCOUNTER — Encounter (INDEPENDENT_AMBULATORY_CARE_PROVIDER_SITE_OTHER): Payer: Self-pay

## 2020-02-07 DIAGNOSIS — K5909 Other constipation: Secondary | ICD-10-CM

## 2020-02-10 MED ORDER — AMBULATORY NON FORMULARY MEDICATION: Status: DC

## 2020-03-12 ENCOUNTER — Other Ambulatory Visit: Payer: Self-pay

## 2020-03-13 MED ORDER — OMEPRAZOLE 20 MG PO CPDR
DELAYED_RELEASE_CAPSULE | ORAL | 11 refills | Status: DC
Start: 2020-03-13 — End: 2020-03-16

## 2020-03-16 ENCOUNTER — Encounter (INDEPENDENT_AMBULATORY_CARE_PROVIDER_SITE_OTHER): Payer: Self-pay

## 2020-03-16 DIAGNOSIS — K219 Gastro-esophageal reflux disease without esophagitis: Secondary | ICD-10-CM

## 2020-03-16 DIAGNOSIS — K5909 Other constipation: Secondary | ICD-10-CM

## 2020-03-16 MED ORDER — POLYETHYLENE GLYCOL 3350 17 GM/SCOOP PO POWD: ORAL | 5 refills | Status: DC

## 2020-03-16 MED ORDER — OMEPRAZOLE 20 MG PO CPDR: DELAYED_RELEASE_CAPSULE | ORAL | 5 refills | Status: DC

## 2020-05-07 ENCOUNTER — Other Ambulatory Visit (INDEPENDENT_AMBULATORY_CARE_PROVIDER_SITE_OTHER): Payer: Self-pay | Admitting: Family

## 2020-05-07 DIAGNOSIS — G4701 Insomnia due to medical condition: Secondary | ICD-10-CM

## 2020-05-07 DIAGNOSIS — F419 Anxiety disorder, unspecified: Secondary | ICD-10-CM

## 2020-05-07 NOTE — Telephone Encounter (Signed)
Please send to the pharmacy is applicable

## 2020-06-29 ENCOUNTER — Other Ambulatory Visit (INDEPENDENT_AMBULATORY_CARE_PROVIDER_SITE_OTHER): Payer: Self-pay | Admitting: Family

## 2020-06-29 DIAGNOSIS — F419 Anxiety disorder, unspecified: Secondary | ICD-10-CM

## 2020-06-29 DIAGNOSIS — G40309 Generalized idiopathic epilepsy and epileptic syndromes, not intractable, without status epilepticus: Secondary | ICD-10-CM

## 2020-07-02 ENCOUNTER — Encounter (INDEPENDENT_AMBULATORY_CARE_PROVIDER_SITE_OTHER): Payer: Self-pay

## 2020-07-02 DIAGNOSIS — K219 Gastro-esophageal reflux disease without esophagitis: Secondary | ICD-10-CM

## 2020-07-02 DIAGNOSIS — F419 Anxiety disorder, unspecified: Secondary | ICD-10-CM

## 2020-07-02 DIAGNOSIS — K5909 Other constipation: Secondary | ICD-10-CM

## 2020-07-02 DIAGNOSIS — F840 Autistic disorder: Secondary | ICD-10-CM

## 2020-07-02 DIAGNOSIS — G40309 Generalized idiopathic epilepsy and epileptic syndromes, not intractable, without status epilepticus: Secondary | ICD-10-CM

## 2020-07-02 DIAGNOSIS — G4720 Circadian rhythm sleep disorder, unspecified type: Secondary | ICD-10-CM

## 2020-07-02 DIAGNOSIS — G4701 Insomnia due to medical condition: Secondary | ICD-10-CM

## 2020-07-03 MED ORDER — HYDROXYZINE HCL 10 MG/5ML PO SYRP: ORAL_SOLUTION | ORAL | 5 refills | Status: DC

## 2020-07-03 MED ORDER — DIAZEPAM 5 MG PO TABS: ORAL_TABLET | ORAL | 5 refills | Status: DC

## 2020-07-03 MED ORDER — LAMOTRIGINE 25 MG PO TBDP: ORAL_TABLET | ORAL | 5 refills | Status: DC

## 2020-07-03 MED ORDER — SERTRALINE HCL 20 MG/ML PO CONC: ORAL | 5 refills | Status: DC

## 2020-07-03 MED ORDER — RISPERIDONE 1 MG/ML PO SOLN
ORAL | 5 refills | Status: DC
Start: 2020-07-03 — End: 2021-01-06

## 2020-07-03 MED ORDER — OMEPRAZOLE 20 MG PO CPDR: DELAYED_RELEASE_CAPSULE | ORAL | 5 refills | Status: DC

## 2020-07-03 MED ORDER — POLYETHYLENE GLYCOL 3350 17 GM/SCOOP PO POWD: ORAL | 5 refills | Status: AC

## 2020-07-06 NOTE — Telephone Encounter (Signed)
Please send to the pharmacy if applicable 

## 2020-07-22 ENCOUNTER — Encounter (INDEPENDENT_AMBULATORY_CARE_PROVIDER_SITE_OTHER): Payer: Self-pay

## 2020-08-26 ENCOUNTER — Telehealth: Payer: Self-pay

## 2020-08-26 NOTE — Telephone Encounter (Signed)
Received phone call from [facility] regarding CMN for incontinence supplies. Verified that fax has been received. Paperwork is in provider box.   Veronda Prude, RN

## 2020-08-29 NOTE — Telephone Encounter (Signed)
Paperwork has been faxed. Thanks

## 2020-09-29 ENCOUNTER — Telehealth (INDEPENDENT_AMBULATORY_CARE_PROVIDER_SITE_OTHER): Payer: Self-pay | Admitting: Family

## 2020-09-29 DIAGNOSIS — G4720 Circadian rhythm sleep disorder, unspecified type: Secondary | ICD-10-CM

## 2020-09-29 DIAGNOSIS — F840 Autistic disorder: Secondary | ICD-10-CM

## 2020-09-29 DIAGNOSIS — F419 Anxiety disorder, unspecified: Secondary | ICD-10-CM

## 2020-09-29 DIAGNOSIS — F802 Mixed receptive-expressive language disorder: Secondary | ICD-10-CM

## 2020-09-29 DIAGNOSIS — G4701 Insomnia due to medical condition: Secondary | ICD-10-CM

## 2020-09-29 NOTE — Telephone Encounter (Signed)
  Who's calling (name and relationship to patient) : mom Best contact number: [phone] Provider they see: [facility]  Reason for call: Mom would like a referral sent to Dr. Thedore Mins so he is able to take over Jamie's psychiatric medications.    PRESCRIPTION REFILL ONLY  Name of prescription:  Pharmacy:

## 2020-09-29 NOTE — Telephone Encounter (Signed)
I put in the referral as requested. TG

## 2020-10-07 NOTE — Telephone Encounter (Signed)
Mom left a voicemail during lunch asking for call back raguarding referral. Call back number is [phone].

## 2020-10-08 NOTE — Telephone Encounter (Signed)
Spoke to mom and let her know that we received the appt from Dr Gloris [place] office

## 2020-11-22 ENCOUNTER — Encounter (INDEPENDENT_AMBULATORY_CARE_PROVIDER_SITE_OTHER): Payer: Self-pay

## 2020-12-02 ENCOUNTER — Other Ambulatory Visit (INDEPENDENT_AMBULATORY_CARE_PROVIDER_SITE_OTHER): Payer: Self-pay | Admitting: Family

## 2020-12-02 ENCOUNTER — Encounter (INDEPENDENT_AMBULATORY_CARE_PROVIDER_SITE_OTHER): Payer: Self-pay

## 2020-12-02 DIAGNOSIS — F419 Anxiety disorder, unspecified: Secondary | ICD-10-CM

## 2020-12-02 DIAGNOSIS — G40309 Generalized idiopathic epilepsy and epileptic syndromes, not intractable, without status epilepticus: Secondary | ICD-10-CM

## 2020-12-02 NOTE — Telephone Encounter (Signed)
Patient has not been seen since March 2021. Please send if applicable

## 2020-12-24 ENCOUNTER — Encounter (INDEPENDENT_AMBULATORY_CARE_PROVIDER_SITE_OTHER): Payer: Self-pay

## 2020-12-24 DIAGNOSIS — G40309 Generalized idiopathic epilepsy and epileptic syndromes, not intractable, without status epilepticus: Secondary | ICD-10-CM

## 2020-12-25 MED ORDER — LAMOTRIGINE 25 MG PO TBDP: ORAL_TABLET | ORAL | 0 refills | Status: DC

## 2021-01-05 ENCOUNTER — Encounter (INDEPENDENT_AMBULATORY_CARE_PROVIDER_SITE_OTHER): Payer: Self-pay

## 2021-01-06 ENCOUNTER — Telehealth (INDEPENDENT_AMBULATORY_CARE_PROVIDER_SITE_OTHER): Payer: Medicaid Other | Admitting: Family

## 2021-01-06 ENCOUNTER — Encounter (INDEPENDENT_AMBULATORY_CARE_PROVIDER_SITE_OTHER): Payer: Self-pay | Admitting: Family

## 2021-01-06 DIAGNOSIS — G4701 Insomnia due to medical condition: Secondary | ICD-10-CM

## 2021-01-06 DIAGNOSIS — G40309 Generalized idiopathic epilepsy and epileptic syndromes, not intractable, without status epilepticus: Secondary | ICD-10-CM | POA: Diagnosis not present

## 2021-01-06 DIAGNOSIS — F419 Anxiety disorder, unspecified: Secondary | ICD-10-CM | POA: Diagnosis not present

## 2021-01-06 DIAGNOSIS — G4720 Circadian rhythm sleep disorder, unspecified type: Secondary | ICD-10-CM

## 2021-01-06 DIAGNOSIS — K219 Gastro-esophageal reflux disease without esophagitis: Secondary | ICD-10-CM

## 2021-01-06 DIAGNOSIS — F840 Autistic disorder: Secondary | ICD-10-CM | POA: Diagnosis not present

## 2021-01-06 MED ORDER — RISPERIDONE 1 MG/ML PO SOLN: ORAL | 5 refills | Status: AC

## 2021-01-06 MED ORDER — DIAZEPAM 5 MG PO TABS: ORAL_TABLET | ORAL | 5 refills | Status: DC

## 2021-01-06 MED ORDER — LAMOTRIGINE 25 MG PO TBDP: ORAL_TABLET | ORAL | 0 refills | Status: DC

## 2021-01-06 MED ORDER — SERTRALINE HCL 20 MG/ML PO CONC: ORAL | 5 refills | Status: AC

## 2021-01-06 MED ORDER — OMEPRAZOLE 20 MG PO CPDR
DELAYED_RELEASE_CAPSULE | ORAL | 5 refills | Status: DC
Start: 2021-01-06 — End: 2022-01-11

## 2021-01-06 MED ORDER — HYDROXYZINE HCL 10 MG/5ML PO SYRP: ORAL_SOLUTION | ORAL | 5 refills | Status: DC

## 2021-01-06 NOTE — Progress Notes (Signed)
This is a Pediatric Specialist E-Visit follow up consult provided via Webex Antonio Silva and their parent/guardian consented to an E-Visit consult today.  Location of patient: Antonio Silva is at Home(location) Location of provider: [name], NP is at Office (location) Patient was referred by Antonio Collum, DO   The following participants were involved in this E-Visit: Antonio Silva, CMA              Antonio Rising, NP Total time on call: 20 min Follow up: 1 year  Patient: Antonio Silva MRN: [id number] Sex: male DOB: 25-Aug-1995   Provider: [name] NP-C Location of Care: [facility]  Visit type: Follow Up  Last visit: 09/30/2019  Referral source: Antonio Manis, DO History from: Epic chart and patient's mother  Brief history:  Copied from previous record: History of autism spectrum disorder with limited language, intellectual disability, 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 He is taking and tolerating Lamotrigine and has remained seizure free since April 2020 when he had a seizure in the setting of missed medication. He has ongoing anxiety that is treated with Sertraline. He also takes Hydroxyzine for insomnia, Diazepam as needed for restless behavior and Risperidone for agitation and aggressive behavior.  Today's concerns:  Mom reports today that Antonio Silva has been having more problems with irritable bowel syndrome and that he has a new gastroenterologist. He is being tried on a new medication called Trulance but Mom says that it hasn't been long enough to know if it will be helpful for him. When Antonio Silva has abdominal pain, he has more self harm and aggressive behavior. Antonio Silva continues to see Dr Antonio Silva for his behavior.   Mom reports that she has no caregivers helping her at this time. She has interviewed someone that is promising to her and could also be an AFL provider in the future. Mom and  Dad are working to do advanced planning for Antonio Silva as they are aging and finding his care more difficult. Mom needs updated medication lists for his CAP-C workers.   Antonio Silva has remained seizure free since his last visit. He has been otherwise generally healthy and Mom has no other health concerns for him today other than previously mentioned.  Review of systems: Please see HPI for neurologic and other pertinent review of systems. Otherwise all other systems were reviewed and were negative.  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   Otitis externa 01/22/2014   Autism spectrum disorder with accompanying intellectual impairment, requiring very substantial support (level 3) 01/07/2014   Anxiety 12/31/2013   GERD (gastroesophageal reflux disease) 12/31/2013   Chronic constipation 12/31/2013   Generalized convulsive epilepsy (HCC) 01/07/2013   Healthcare maintenance 01/07/2013   Autistic disorder 01/07/2013     Past Medical History:  Diagnosis Date   Anxiety    Autistic disorder, current or active state    Chronic constipation    GERD (gastroesophageal reflux disease)    Seizures (HCC)     Past medical history comments: See HPI Copied from previous record:   Birth History 6 lbs. 11 oz. infant born at full-term to a 61 year old primigravida.   Mother gained 30 pounds   Labor lasted  for 10 hours   Normal spontaneous vaginal delivery   Delayed developmental milestones particularly for language.   Behavior History autism spectrum disorder, restless, agitated, self-injurious  Surgical history: Past Surgical History:  Procedure Laterality Date   FECAL DISIMPACTION  2018   WISDOM TOOTH EXTRACTION      age 38     Family history: family history includes Asthma in his mother; Breast cancer in his paternal grandmother; Depression in his mother; Diabetes in his mother; Heart attack in his maternal grandfather and maternal grandmother; Hyperlipidemia in his mother; Hypertension in his mother; Ulcerative colitis in his father.   Social history: Social History   Socioeconomic History   Marital status: Single    Spouse name: Not on file   Number of children: Not on file   Years of education: Not on file   Highest education level: Not on file  Occupational History   Not on file  Tobacco Use   Smoking status: Never   Smokeless tobacco: Never  Vaping Use   Vaping Use: Never used  Substance and Sexual Activity   Alcohol use: No   Drug use: No   Sexual activity: Never  Other Topics Concern   Not on file  Social History Narrative   Antonio Silva is 25 yo male.   He has caregiver and he goes to their home.    He lives with both parents and has no siblings.    He enjoys attending his day program and walking   Social Determinants of Health   Financial Resource Strain: Not on file  Food Insecurity: Not on file  Transportation Needs: Not on file  Physical Activity: Not on file  Stress: Not on file  Social Connections: Not on file  Intimate Partner Violence: Not on file    Past/failed meds:   Allergies: Allergies  Allergen Reactions   Ketamine Other (See Comments)    CAUSES SEIZURES    Other     Lactose     Immunizations: Immunization History  Administered Date(s) Administered   Influenza,inj,Quad PF,6+ Mos 05/28/2014, 05/18/2015, 05/19/2016, 05/22/2019   PFIZER(Purple Top)SARS-COV-2 Vaccination 09/25/2019, 10/16/2019   Tdap 11/21/2017     Diagnostics/Screenings:  Physical Exam: Ht 5\' 7"  (1.702 m) Comment: mom reported  Wt 170 lb (77.1 kg) Comment: mom reported  BMI 26.63 kg/m   General: well developed, well nourished man, pacing in his home, in no evident  distress Head: normocephalic and atraumatic. No dysmorphic features. Neck: supple Musculoskeletal: no skeletal deformities or obvious scoliosis. Skin: no rashes or neurocutaneous lesions  Neurologic Exam Mental Status: awake and fully alert. Has no language.  Smiles responsively at times. Resistant to invasions in to his space. Unable to follow commands or participate in examination. Cranial Nerves: turns to localize faces, objects and sounds in the periphery. Facial movements are symmetric. Motor: normal functional bulk, tone and strength Sensory: withdrawal x 4 Coordination: unable to adequately assess due to patient's inability to participate in examination. No dysmetria when reaching for objects. Gait and Station: stance and gait are normal  Impression: Autism spectrum disorder with accompanying intellectual impairment, requiring very substantial support (level 3) - Plan: diazepam (VALIUM) 5 MG tablet, risperiDONE (RISPERDAL) 1 MG/ML oral solution  Anxiety - Plan: diazepam (VALIUM) 5 MG tablet, hydrOXYzine (ATARAX) 10 MG/5ML syrup, risperiDONE (RISPERDAL) 1 MG/ML oral solution, sertraline (ZOLOFT) 20 MG/ML concentrated solution  Insomnia due to medical condition - Plan: hydrOXYzine (ATARAX) 10 MG/5ML syrup  Generalized convulsive epilepsy (HCC) - Plan: lamotrigine (LAMICTAL) 25 MG  disintegrating tablet  Circadian rhythm sleep disorder - Plan: risperiDONE (RISPERDAL) 1 MG/ML oral solution  Gastroesophageal reflux disease without esophagitis - Plan: omeprazole (PRILOSEC) 20 MG capsule   Recommendations for plan of care: The patient's previous Epic records were reviewed. Antonio Silva has neither had nor required imaging or lab studies since the last visit. He is a 25 year old man with history of autism, intellectual disability, insomnia, agitated behavior and generalized convulsive epilepsy. His seizures have been in good control with Lamotrigine. He also has irritable bowel syndrome and is  followed by gastroenterology. He is followed by psychiatry for his behavior. Mom needs updated medication lists which I will send to her. I will otherwise see Antonio Silva back in follow up in 1 year or sooner if needed. Mom agreed with the plans made today.   Return in about 1 year (around 01/06/2022).    The medication list was reviewed and reconciled. No changes were made in the prescribed medications today. A complete medication list was provided to the patient.  Allergies as of 01/06/2021       Reactions   Ketamine Other (See Comments)   CAUSES SEIZURES    Other    Lactose        Medication List        Accurate as of January 06, 2021 11:59 PM. If you have any questions, ask your nurse or doctor.          diazepam 5 MG tablet Commonly known as: VALIUM CRUSH AND GIVE 1 TABLET BY MOUTH UP TO 3 TIMES PER DAY PRN ANXIETY OR AGITATION What changed: additional instructions Changed by: Antonio Rising, NP   hydrOXYzine 10 MG/5ML syrup Commonly known as: ATARAX TAKE 25 MLS BY MOUTH EVERY NIGHT AT BEDTIME   lamotrigine 25 MG disintegrating tablet Commonly known as: LAMICTAL DISSOLVE 5 TABLETS IN 1 TSP WATER AND GIVE IN JUICE TWICE DAILY   omeprazole 20 MG capsule Commonly known as: PRILOSEC TAKE 1 CAPSULE BY MOUTH TWICE A DAY BEFORE A MEAL, CAN OPEN AND PLACE IN FOOD OR DRINK   polyethylene glycol powder 17 GM/SCOOP powder Commonly known as: GaviLAX TAKE 17 G BY MOUTH 3 (THREE) TIMES DAILY.   risperiDONE 1 MG/ML oral solution Commonly known as: RISPERDAL TAKE TWICE A DAY   sertraline 20 MG/ML concentrated solution Commonly known as: ZOLOFT TAKE 1.5 ML IN THE MORNING   Trulance 3 MG Tabs Generic drug: Plecanatide Take by mouth.        Total time spent with the patient was 20 minutes, of which 50% or more was spent in counseling and coordination of care.  Antonio Rising NP-C [facility] Ph. [phone] Fax [phone]

## 2021-01-07 ENCOUNTER — Encounter (INDEPENDENT_AMBULATORY_CARE_PROVIDER_SITE_OTHER): Payer: Self-pay

## 2021-01-10 ENCOUNTER — Encounter (INDEPENDENT_AMBULATORY_CARE_PROVIDER_SITE_OTHER): Payer: Self-pay | Admitting: Family

## 2021-01-10 DIAGNOSIS — G4701 Insomnia due to medical condition: Secondary | ICD-10-CM | POA: Insufficient documentation

## 2021-01-10 NOTE — Patient Instructions (Signed)
Thank you for meeting with me by video today.   Instructions for you until your next appointment are as follows: Continue Jamie's medications as you have been giving them I will mail you updated medication lists.  Please sign up for MyChart if you have not done so. Please plan to return for follow up in one year or sooner if needed.  At [facility], we are committed to providing exceptional care. You will receive a patient satisfaction survey through text or email regarding your visit today. Your opinion is important to me. Comments are appreciated.

## 2021-02-08 ENCOUNTER — Other Ambulatory Visit (INDEPENDENT_AMBULATORY_CARE_PROVIDER_SITE_OTHER): Payer: Self-pay | Admitting: Family

## 2021-02-08 DIAGNOSIS — G40309 Generalized idiopathic epilepsy and epileptic syndromes, not intractable, without status epilepticus: Secondary | ICD-10-CM

## 2021-03-28 ENCOUNTER — Other Ambulatory Visit (INDEPENDENT_AMBULATORY_CARE_PROVIDER_SITE_OTHER): Payer: Self-pay | Admitting: Pediatrics

## 2021-03-28 DIAGNOSIS — G40309 Generalized idiopathic epilepsy and epileptic syndromes, not intractable, without status epilepticus: Secondary | ICD-10-CM

## 2021-03-30 ENCOUNTER — Telehealth: Payer: Self-pay | Admitting: Gastroenterology

## 2021-03-30 NOTE — Telephone Encounter (Signed)
Good morning Dr. Lavon Paganini, we received a referral for patient to be seen for constipation.  He has been to another GI within last year but are unhappy with their care.  They requested an appt with you.  Records are in Care Everywhere in Epic.  Can you please review and advise on scheduling?  Thank you.

## 2021-03-30 NOTE — Telephone Encounter (Signed)
Request received to transfer GI care from outside practice to [facility].  We appreciate the interest in our practice, however at this time due to high demand from patients without established GI providers we cannot accommodate this transfer.  Ability to accommodate future transfer requests may change over time and the patient can contact us again in 6-12 months if still interested in being seen at [facility].      °

## 2021-03-31 NOTE — Telephone Encounter (Signed)
Informed pt's mother we cannot accommodate transfer of care at the moment.

## 2021-04-12 ENCOUNTER — Other Ambulatory Visit (INDEPENDENT_AMBULATORY_CARE_PROVIDER_SITE_OTHER): Payer: Self-pay | Admitting: Pediatrics

## 2021-04-12 DIAGNOSIS — G40309 Generalized idiopathic epilepsy and epileptic syndromes, not intractable, without status epilepticus: Secondary | ICD-10-CM

## 2021-04-26 ENCOUNTER — Other Ambulatory Visit (INDEPENDENT_AMBULATORY_CARE_PROVIDER_SITE_OTHER): Payer: Self-pay | Admitting: Pediatrics

## 2021-04-26 DIAGNOSIS — G40309 Generalized idiopathic epilepsy and epileptic syndromes, not intractable, without status epilepticus: Secondary | ICD-10-CM

## 2021-05-20 ENCOUNTER — Other Ambulatory Visit (HOSPITAL_COMMUNITY): Payer: Self-pay | Admitting: Physician Assistant

## 2021-05-20 ENCOUNTER — Other Ambulatory Visit: Payer: Self-pay | Admitting: Physician Assistant

## 2021-05-20 DIAGNOSIS — R109 Unspecified abdominal pain: Secondary | ICD-10-CM

## 2021-05-21 ENCOUNTER — Ambulatory Visit: Payer: Self-pay | Admitting: Dentistry

## 2021-05-21 NOTE — Progress Notes (Addendum)
Anesthesia Chart Review: SAME DAY WORK-UP  Case: [id number] Date/Time: 05/27/21 1045   Procedure: DENTAL RESTORATION/EXTRACTION WITH X-RAY (Bilateral)   Anesthesia type: General   Pre-op diagnosis: DENTAL CARIES   Location: [facility] ROOM 09 / [facility]   Surgeons: Antonio Silva, DMD       DISCUSSION: "Antonio Silva" is a 25 year old male scheduled for dental restoration with extractions/xrays by Dr. Rocky Silva and CT abd/pelvis under anesthesia on 05/27/21.   History includes never smoker, autism (with limited language, intellectual disability), seizures (generalized convulsive epilepsy), GERD, chronic constipation (fecal disimpaction under anesthesia 06/17/16), anxiety.   H&P visit 05/18/21 with Antonio Mis, MD ([facility]). She wrote, "H&P completed for patient's upcoming dental restoration under anesthesia. History of snoring and seizure with ketamine use with previous sedation and will need to be evaluated by anesthesiology Labs ordered for coordination with primary care, GI and psychiatry." Labs ordered (to be done under anesthesia and ordered by Boneta Lucks, DDS):  Lipid Panel   Hemoglobin A1c   Vitamin D 25 Hydroxy   Vitamin B12   CBC And Differential   Comprehensive Metabolic Panel   TSH+Free T4   Antonio Silva's mother Antonio Silva [phone]) requested phone call to discuss anesthesia concerns. I left a voice message for her to contact me. The following information was obtained from previous anesthesia records in Care Everywhere: 04/10/2019 [facility]:  Gastrograffin Enema for severe constipation  Pre-op anesthesiologist's notes indicated reported possible seizure after ketamine administration in 2016, but no detail documented in anesthesia record; allowed PIV in ER 2019  Anesthesia Plan:  11:21 AM Anesthesiologist Documentation: Antonio Chute, MD  IVGA Pt extremely agitated upon arrival to RPPU, unable to even attempt IV placement, o/w inhalational indxn w/ restraint (father/mother agree to this,  they are very aware of how challenging the situation is and did this last time) Does not feel attempt at intra-nasal precedex is reasonable, says he will not tolerate. MM n/a N95 due to COVID testing refusal and unable to wear mask due to sensory issues related to autism)Anesthetic plan and risks discussed with patient, father and mother. ASA 3  General  1136: Anesthesiologist Documentation: Antonio Chute, MD 04/10/2019 / 12:05 PM Pt brought to Fluoro suite w/ parents and RPPU RN escort. Attempted to placate pt w/ videos but unable to get pt to sit on stretcher. Pt becoming increasingly agitated, eventually was restrained by RN and father, Sevo 8% inhalational indxn successful, pt quickly laid on stretcher and mask ventilation was established, SpO2 100%, 18g PIV R forearm established, pt intubated w/o issue. Moved to procedure table... 1251: Pt to PACU on 4L [place]. Monitors connected, VSS. Airway patent, pain controlled. Emerged, extubated, transported to PACU w/o event. Father awaiting pt in PACU.   06/17/2016 [facility]:  Perineal exam under anesthesia, anesthetic block of the anus with Exparel, anorectal stool disimpaction of over 30 minutes of calcified stool   Mom reported he may be combative in "flight or fight" mode and to have extra people on hand. Needs to be sedated.   Anesthesia Plan: Pt had seizure activity after last GA with a ketamine dart. Pt would not take his po valium this am for his parents. Tried to get pt to drink some po versed mixed with sugar to try and alleviate the bitterness, but he would not take any. Plan to take pt back to the OR with parents for mask induction.  1410: Patient walked to OR with parents and OR staff. Mask inhalation  induction. IV placed in left arm. Breathing spontaneously with mask support. VSS... 1453: Case longer than expected. LMA placed. Easy LMA placement. BBS = with good air exchange. Mouth and teeth in preoperative condition. VSS.   05/28/2015  [facility]  Dental surgeon, molar extractions  Parents gave Valium prior to arrival 1050: 350 ( 4mg /kg ) mg IM ketamine given in preop, pt extremely anxious and combative. Ketamine took @ 10 minutes to work. Pt then placed on bed and brought back to OR 40. 20g PIV placed in left hand. IV induction 7.0   ETT: 6.5; Cuffed, Nasal, RAE; Easy; No; Laryngoscope; Miller; 2; 1; +EtCo2, Breathing Equal and Symmetric; None; Nasal; 1  05/28/15:  Final Anesthetic Type: General  Patient Evaluation Location: PACU  Patient Participation: Patient participated  Level of Consciousness: awake and alert and oriented  Pain Management: adequately controlled during entire PACU stay  Nausea/Vomiting: None  Post-op vitals signs: Stable  Post op temperature: Normothermic  Cardiovascular Status: hemodynamically stable  Respiratory Status: Stable, room air, spontaneous  Postoperative Hydration: adequately hydrated  Post op disposition: Home  Anesthetic Complications: no anesthesia complications noted  Additional Comments: Sleepy, eyes open, calm. Mom says she is good with d/c  Electronically signed by: 13/10/16, MD 05/28/15 1504   UPDATE 05/24/20 4:42 PM:  I spoke with Antonio Silva's mother 13/7/21.  She reported that Antonio Silva is functionally nonverbal but will say "bye" when he wants to leave.  He tends to have a fight or flight response in new environments.  She is concerned that he will become increasingly agitated if he has to arrive too early for surgery.  They do have Valium as needed but often it is taken up to 3 doses, so reportedly Antonio Silva is looking at other options.  Mom mentioned potentially Halcion.  Without any sedation, she thinks he would likely have to be held down until given inhalation anesthesia.  As above he cannot take ketamine as he had a seizure right after ketamine injection in the past.  He is able to eat but will not swallow pills.  His medications either need to be in liquid form or  crushed and mixed in juice.  Historically he has needed his IV placed under anesthesia.  She confirmed that labs will need to be drawn under anesthesia, or he will fight.  She reported good control of his seizures last seizure over a year ago.  Since he cannot be given ketamine, historically he has been the most cooperative when she and her husband have been allowed to gowned up and escort him and staff to the operating room for induction.  She understands that in general this has not been allowed, but she will discuss this with the assigned anesthesiologist. Our nursing staff will reach out to her in the next 1-2 days to complete preoperative instructions. In the interim, I will reach out to our RN Flow coordinator or director to determine best arrival times as mother says the longer is has to wait, the more agitated he will become. I will also follow-up with anesthesia and/or radiology staff regarding the coordination of anesthesia from the OR to radiology. (UPDATE 05/25/21 10:12 AM: 13/8/22 from Dr. Valentina Gu office left a message indicating that Halcion would be prescribed to take before coming in for surgery. He will need to be in Holding 45 minutes prior to procedure. CRNA staff will coordinate with radiology. I updated several anesthesiologists about case and anesthesia history.)   VS: 01/06/21: WT 77.1 kg.  As of 05/24/21, mother Lupita Leash says he weighs ~ 175-180 lb and is ~ 5 ft 7 1/2 inches tall.   PROVIDERS: Antonio Mis, MD is PCP [facility]) [name], NP is neurology provider with [facility]. Last visit 01/06/21.  Thedore Mins, MD is psychiatrist Mariana Kaufman, MD is GI. Initial consult 04/14/21 by Kayren Eaves, PA at [facility] Everywhere). Previous notes are with Gibson Ramp, MD with [facility].    LABS: For day of surgery.    Past Medical History:  Diagnosis Date   Anxiety    Autistic disorder,  current or active state    Chronic constipation    GERD (gastroesophageal reflux disease)    Seizures (HCC)     Past Surgical History:  Procedure Laterality Date   FECAL DISIMPACTION  2018   WISDOM TOOTH EXTRACTION     age 30    MEDICATIONS: No current facility-administered medications for this encounter.    Cyanocobalamin (B-12) 5000 MCG SUBL   diazepam (VALIUM) 5 MG tablet   hydrOXYzine (ATARAX) 10 MG/5ML syrup   lamotrigine (LAMICTAL) 25 MG disintegrating tablet   Magnesium Hydroxide (DULCOLAX PO)   magnesium hydroxide (DULCOLAX) 400 MG/5ML suspension   omeprazole (PRILOSEC) 20 MG capsule   OVER THE COUNTER MEDICATION   polyethylene glycol powder (GAVILAX) 17 GM/SCOOP powder   risperiDONE (RISPERDAL) 1 MG/ML oral solution   sertraline (ZOLOFT) 20 MG/ML concentrated solution   OTC is Triphala 1 mg daily. Triphala is a herbal remedy that consists of Haritaki (terminalia chebula), Bibhitaki (terminalia bellirica, and amla (emblica officinalis) and is used used to symptoms related to constipation and inflammation.   Shonna Chock, PA-C Surgical Short Stay/Anesthesiology [facility] Phone [phone] [facility] Phone [phone] 05/24/2021 3:11 PM

## 2021-05-25 ENCOUNTER — Other Ambulatory Visit: Payer: Self-pay

## 2021-05-25 ENCOUNTER — Encounter (HOSPITAL_COMMUNITY): Payer: Self-pay | Admitting: *Deleted

## 2021-05-25 NOTE — Anesthesia Preprocedure Evaluation (Addendum)
Anesthesia Evaluation  Patient identified by MRN, date of birth, ID band Patient awake    Reviewed: Allergy & Precautions, NPO status , Patient's Chart, lab work & pertinent test results  History of Anesthesia Complications Negative for: history of anesthetic complications  Airway Mallampati: II  TM Distance: >3 FB Neck ROM: Full    Dental  (+) Poor Dentition   Pulmonary neg pulmonary ROS,    Pulmonary exam normal        Cardiovascular negative cardio ROS Normal cardiovascular exam     Neuro/Psych Seizures - (last seizure approximately 1 year ago),  Anxiety Severe nonverbal autism    GI/Hepatic Neg liver ROS, GERD  Medicated and Controlled,  Endo/Other  negative endocrine ROS  Renal/GU negative Renal ROS  negative genitourinary   Musculoskeletal negative musculoskeletal ROS (+)   Abdominal   Peds  Hematology negative hematology ROS (+)   Anesthesia Other Findings Day of surgery medications reviewed with patient.  Reproductive/Obstetrics negative OB ROS                            Anesthesia Physical Anesthesia Plan  ASA: 3  Anesthesia Plan: General   Post-op Pain Management:    Induction: Inhalational  PONV Risk Score and Plan: Treatment may vary due to age or medical condition, Dexamethasone, Ondansetron and Midazolam  Airway Management Planned: Nasal ETT  Additional Equipment: None  Intra-op Plan:   Post-operative Plan: Extubation in OR  Informed Consent: I have reviewed the patients History and Physical, chart, labs and discussed the procedure including the risks, benefits and alternatives for the proposed anesthesia with the patient or authorized representative who has indicated his/her understanding and acceptance.     Dental advisory given and Consent reviewed with POA  Plan Discussed with: CRNA  Anesthesia Plan Comments: (See PAT note written 05/25/2021 by Myra Gianotti, PA-C. 25 year old male with autism, functionally non-verbal, seizure disorder. Anesthesia history outlined in note and includes seizure after ketamine, and mom did not think he would tolerate attempts at intra-nasal precedex. He is for labs under anesthesia and is to go to CT after dental procedure. Dr. Link Snuffer is reportedly prescribing Halcion 0.375 mg po one hour before arrival.    Patient and his parents met in parking lot because parents were unable to get him out of the car by themselves due to patient resistance/refusal. Patient was prescribed Halcion to take prior to arrival but refused all meds this morning (including antiepileptic). Hospital security officers present to assist as well as preop RN and CRNAs. Security able to get patient out of car with some resistance and brought to short stay. Patient refused attempt at oral versed. Plan discussed with parents. Will attempt intranasal precedex. If needed, will give additional sedation via IM route. Daiva Huge, MD)     Anesthesia Quick Evaluation

## 2021-05-25 NOTE — Progress Notes (Signed)
Spoke with pt's father, Coulton Schlink for pre-op call. Pt has autism and is non verbal. Both parents will be with pt, he has severe anxiety especially in hospital settings. Pt will be having labs drawn, dental procedures done and then a CT scan under anesthesia.  Mr. Herberger instructed to arrive at 10 AM Thursday, so pt will have a short pre-op stay. This per Maudry Diego, RN See Allison's note on 05/21/21.   Pt's surgery is scheduled as ambulatory so no Covid test is required prior to surgery.

## 2021-05-27 ENCOUNTER — Encounter (HOSPITAL_COMMUNITY)
Admission: RE | Disposition: A | Discharge status: Home / Self Care | Payer: Self-pay | Source: Ambulatory Visit | Attending: Dentistry

## 2021-05-27 ENCOUNTER — Ambulatory Visit (HOSPITAL_COMMUNITY): Payer: Medicaid Other | Admitting: Vascular Surgery

## 2021-05-27 ENCOUNTER — Ambulatory Visit (HOSPITAL_COMMUNITY)
Admission: RE | Admit: 2021-05-27 | Discharge: 2021-05-27 | Disposition: A | Discharge status: Home / Self Care | Payer: Medicaid Other | Source: Ambulatory Visit | Attending: Dentistry | Admitting: Dentistry

## 2021-05-27 ENCOUNTER — Ambulatory Visit (HOSPITAL_COMMUNITY)
Admission: RE | Admit: 2021-05-27 | Discharge: 2021-05-27 | Disposition: A | Discharge status: Home / Self Care | Payer: Medicaid Other | Source: Ambulatory Visit | Attending: Physician Assistant | Admitting: Physician Assistant

## 2021-05-27 DIAGNOSIS — R569 Unspecified convulsions: Secondary | ICD-10-CM | POA: Insufficient documentation

## 2021-05-27 DIAGNOSIS — F840 Autistic disorder: Secondary | ICD-10-CM | POA: Insufficient documentation

## 2021-05-27 DIAGNOSIS — K006 Disturbances in tooth eruption: Secondary | ICD-10-CM | POA: Insufficient documentation

## 2021-05-27 DIAGNOSIS — K219 Gastro-esophageal reflux disease without esophagitis: Secondary | ICD-10-CM | POA: Insufficient documentation

## 2021-05-27 DIAGNOSIS — K5909 Other constipation: Secondary | ICD-10-CM | POA: Insufficient documentation

## 2021-05-27 DIAGNOSIS — K029 Dental caries, unspecified: Secondary | ICD-10-CM | POA: Insufficient documentation

## 2021-05-27 DIAGNOSIS — F419 Anxiety disorder, unspecified: Secondary | ICD-10-CM | POA: Diagnosis not present

## 2021-05-27 DIAGNOSIS — K08109 Complete loss of teeth, unspecified cause, unspecified class: Secondary | ICD-10-CM | POA: Diagnosis not present

## 2021-05-27 DIAGNOSIS — R109 Unspecified abdominal pain: Secondary | ICD-10-CM

## 2021-05-27 HISTORY — PX: RADIOLOGY WITH ANESTHESIA: SHX6223

## 2021-05-27 HISTORY — PX: DENTAL RESTORATION/EXTRACTION WITH X-RAY: SHX5796

## 2021-05-27 SURGERY — DENTAL RESTORATION/EXTRACTION WITH X-RAY
Anesthesia: General | Laterality: Bilateral

## 2021-05-27 SURGERY — CT WITH ANESTHESIA
Anesthesia: General

## 2021-05-27 MED ORDER — LACTATED RINGERS IV SOLN: INTRAVENOUS | Status: DC

## 2021-05-27 MED ORDER — HALOPERIDOL LACTATE 5 MG/ML IJ SOLN
10.0000 mg | Freq: Once | INTRAMUSCULAR | Status: AC
  Administered 2021-05-27: 10 mg via INTRAMUSCULAR
  Filled 2021-05-27 (×2): qty 2

## 2021-05-27 MED ORDER — IOHEXOL 300 MG/ML  SOLN
100.0000 mL | Freq: Once | INTRAMUSCULAR | Status: AC | PRN
  Administered 2021-05-27: 100 mL via INTRAVENOUS

## 2021-05-27 MED ORDER — LORAZEPAM 2 MG/ML IJ SOLN
INTRAMUSCULAR | Status: AC
  Filled 2021-05-27: qty 1

## 2021-05-27 MED ORDER — ROCURONIUM BROMIDE 10 MG/ML (PF) SYRINGE
PREFILLED_SYRINGE | INTRAVENOUS | Status: DC | PRN
  Administered 2021-05-27: 60 mg via INTRAVENOUS

## 2021-05-27 MED ORDER — LORAZEPAM 2 MG/ML IJ SOLN
INTRAMUSCULAR | Status: DC | PRN
  Administered 2021-05-27: 2 mg via INTRAVENOUS

## 2021-05-27 MED ORDER — MIDAZOLAM HCL 2 MG/2ML IJ SOLN
INTRAMUSCULAR | Status: AC
  Filled 2021-05-27: qty 2

## 2021-05-27 MED ORDER — DEXMEDETOMIDINE HCL 200 MCG/2ML IV SOLN
300.0000 ug | Freq: Once | INTRAVENOUS | Status: AC
  Administered 2021-05-27: 300 ug via INTRAVENOUS
  Filled 2021-05-27: qty 3

## 2021-05-27 MED ORDER — OXYMETAZOLINE HCL 0.05 % NA SOLN
NASAL | Status: AC
  Filled 2021-05-27: qty 30

## 2021-05-27 MED ORDER — ONDANSETRON HCL 4 MG/2ML IJ SOLN
INTRAMUSCULAR | Status: DC | PRN
  Administered 2021-05-27: 4 mg via INTRAVENOUS

## 2021-05-27 MED ORDER — LIDOCAINE 2% (20 MG/ML) 5 ML SYRINGE
INTRAMUSCULAR | Status: DC | PRN
  Administered 2021-05-27: 60 mg via INTRAVENOUS

## 2021-05-27 MED ORDER — FENTANYL CITRATE (PF) 250 MCG/5ML IJ SOLN
INTRAMUSCULAR | Status: DC | PRN
  Administered 2021-05-27: 100 ug via INTRAVENOUS

## 2021-05-27 MED ORDER — SUGAMMADEX SODIUM 200 MG/2ML IV SOLN
INTRAVENOUS | Status: DC | PRN
  Administered 2021-05-27: 400 mg via INTRAVENOUS

## 2021-05-27 MED ORDER — PROPOFOL 10 MG/ML IV BOLUS
INTRAVENOUS | Status: DC | PRN
  Administered 2021-05-27: 50 mg via INTRAVENOUS

## 2021-05-27 MED ORDER — 0.9 % SODIUM CHLORIDE (POUR BTL) OPTIME
TOPICAL | Status: DC | PRN
  Administered 2021-05-27: 1000 mL

## 2021-05-27 MED ORDER — PHENYLEPHRINE HCL (PRESSORS) 10 MG/ML IV SOLN
INTRAVENOUS | Status: DC | PRN
  Administered 2021-05-27: 80 ug via INTRAVENOUS

## 2021-05-27 MED ORDER — FENTANYL CITRATE (PF) 250 MCG/5ML IJ SOLN
INTRAMUSCULAR | Status: AC
  Filled 2021-05-27: qty 5

## 2021-05-27 MED ORDER — PROPOFOL 500 MG/50ML IV EMUL
INTRAVENOUS | Status: DC | PRN
  Administered 2021-05-27: 80 ug/kg/min via INTRAVENOUS

## 2021-05-27 MED ORDER — CHLORHEXIDINE GLUCONATE 0.12 % MT SOLN: 15.0000 mL | Freq: Once | OROMUCOSAL | Status: DC

## 2021-05-27 MED ORDER — OXYMETAZOLINE HCL 0.05 % NA SOLN
NASAL | Status: DC | PRN
  Administered 2021-05-27: 2 via NASAL

## 2021-05-27 MED ORDER — ORAL CARE MOUTH RINSE: 15.0000 mL | Freq: Once | OROMUCOSAL | Status: DC

## 2021-05-27 MED ORDER — MIDAZOLAM HCL 2 MG/ML PO SYRP
ORAL_SOLUTION | ORAL | Status: AC
  Filled 2021-05-27: qty 8

## 2021-05-27 MED ORDER — DEXAMETHASONE SODIUM PHOSPHATE 10 MG/ML IJ SOLN
INTRAMUSCULAR | Status: DC | PRN
  Administered 2021-05-27: 5 mg via INTRAVENOUS

## 2021-05-27 SURGICAL SUPPLY — 17 items
CANISTER SUCT 3000ML PPV (MISCELLANEOUS) ×2 IMPLANT
COVER BACK TABLE 60X90IN (DRAPES) ×2 IMPLANT
COVER MAYO STAND STRL (DRAPES) ×2 IMPLANT
COVER SURGICAL LIGHT HANDLE (MISCELLANEOUS) ×2 IMPLANT
DRAPE HALF SHEET 40X57 (DRAPES) ×2 IMPLANT
GAUZE PACKING FOLDED 2  STR (GAUZE/BANDAGES/DRESSINGS) ×2
GAUZE PACKING FOLDED 2 STR (GAUZE/BANDAGES/DRESSINGS) IMPLANT
GAUZE SPONGE 4X4 12PLY STRL (GAUZE/BANDAGES/DRESSINGS) ×1 IMPLANT
GLOVE SURG ENC MOIS LTX SZ6.5 (GLOVE) ×2 IMPLANT
GOWN STRL REUS W/ TWL LRG LVL3 (GOWN DISPOSABLE) ×2 IMPLANT
GOWN STRL REUS W/TWL LRG LVL3 (GOWN DISPOSABLE) ×4
KIT TURNOVER KIT B (KITS) ×2 IMPLANT
NS IRRIG 1000ML POUR BTL (IV SOLUTION) ×1 IMPLANT
PAD ARMBOARD 7.5X6 YLW CONV (MISCELLANEOUS) ×4 IMPLANT
TOWEL GREEN STERILE (TOWEL DISPOSABLE) ×2 IMPLANT
TUBE CONNECTING 12X1/4 (SUCTIONS) ×2 IMPLANT
YANKAUER SUCT BULB TIP NO VENT (SUCTIONS) ×2 IMPLANT

## 2021-05-27 NOTE — Op Note (Signed)
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 Dental Care provided in OR for medically necessary treatment.  Surgeon: Joanna Hews, DMD  Assistant: Ruben Gottron and hospital staff.  Anesthesia: General  Procedure: The patient was brought into the operating room and placed on the table in a supine position.  General anesthesia was administered via nasal intubation.  The patient was prepped and draped in the usual manner for an intra-oral general dentistry procedure. The oropharynx was suctioned and a moistened oropharyngeal throat pack was placed.    A full intra-oral exam including all hard and soft tissues was performed.  Type of Exam: Initial   Soft Tissue Exam: Floor of the mouth: Normal Buccal mucosa: Normal Soft palate: Normal Hard palate: Normal Tongue: Normal Gingival:  Inflammed Frenum: Normal  Hard tissue exam:  Present: # 2-15, 18-32 Missing: # 1, 16, 17 Un-erupted: # 32 Radiographic findings decay: # none (caries on #14-O clinically detected)  Full mouth series of digital radiographs taken and reviewed. A comprehensive treatment plan was developed.  Operative care was accomplished in a standard fashion using high/low speed drills with copious irrigation.  The estimated blood loss was 5 mls.   Upon completion of all procedures the oropharynx was irrigated of all debris. Mouth was suctioned dry and a posterior throat pack was carefully removed with constant suction. After spontaneous respirations the patient was extubated and transported to the Post-Anesthesia care unit in awake but in a sedated condition. The patient tolerated the procedure well and without complications.  An explanation of procedures and extractions were given to parents.  Operative Procedures:  Full mouth debridement: Yes Glass Ionomers: # 14-O (coronal, chewing,  dentin) Fluoride varnish: Yes    Postoperative Instructions: Verbally given to patient representative.    Joanna Hews, DMD

## 2021-05-27 NOTE — Anesthesia Procedure Notes (Addendum)
Procedure Name: Intubation Date/Time: 05/27/2021 12:02 PM Performed by: Lorie Phenix, CRNA Pre-anesthesia Checklist: Patient identified, Emergency Drugs available, Suction available and Patient being monitored Patient Re-evaluated:Patient Re-evaluated prior to induction Oxygen Delivery Method: Circle System Utilized Preoxygenation: Pre-oxygenation with 100% oxygen Induction Type: Inhalational induction Ventilation: Mask ventilation without difficulty Laryngoscope Size: Mac and 4 Grade View: Grade I Nasal Tubes: Nasal Rae, Nasal prep performed, Right and Magill forceps- large, utilized Tube size: 7.5 mm Number of attempts: 1 Placement Confirmation: ETT inserted through vocal cords under direct vision, positive ETCO2 and breath sounds checked- equal and bilateral Secured at: 29 cm Tube secured with: Tape Dental Injury: Teeth and Oropharynx as per pre-operative assessment  Comments: Red rubber and Afrin used. No complications encountered.

## 2021-05-27 NOTE — Progress Notes (Signed)
Patient placed on adult ventilator in PACU at 1347. Settings PRVC: VT 500, RR 16, 40%, 5 Peep. CRNA extubated patient at 1355. RT stood by as CRNA bagged until patient came around a little more. Vent on standby if needed.

## 2021-05-27 NOTE — Brief Op Note (Signed)
05/27/2021  1:11 PM  PATIENT:  [name]  25 y.o. male  PRE-OPERATIVE DIAGNOSIS:  DENTAL CARIES  POST-OPERATIVE DIAGNOSIS:  DENTAL CARIES  PROCEDURE:  Procedure(s): Filling 14-0 Full Mouth Debridement. Fluoride Application. (Bilateral)  SURGEON:  Surgeon(s) and Role:    * Stevie Charter, Arley Phenix, DMD - Primary   ASSISTANTS: Ruben Gottron and hospital staff   ANESTHESIA:   general  EBL:  23ml   BLOOD ADMINISTERED:none  DRAINS: none   LOCAL MEDICATIONS USED:  NONE  SPECIMEN:  No Specimen  DISPOSITION OF SPECIMEN:  N/A  COUNTS:  YES  TOURNIQUET:  * No tourniquets in log *  DICTATION: .Dragon Dictation  PLAN OF CARE: Discharge to home after PACU  PATIENT DISPOSITION:  PACU - hemodynamically stable.   Delay start of Pharmacological VTE agent (>24hrs) due to surgical blood loss or risk of bleeding: not applicable

## 2021-05-27 NOTE — Progress Notes (Signed)
Parents expressing concerns that CT of chest is not included with CT.  Nurse has reached out to ordering physician and no new orders received.

## 2021-05-27 NOTE — H&P (Signed)
H&P reviewed. Stable for surgery Aylana Hirschfeld H Adah Stoneberg, DMD  

## 2021-05-27 NOTE — Transfer of Care (Signed)
Immediate Anesthesia Transfer of Care Note  Patient: Antonio Silva  Procedure(s) Performed: Filling 14-0 Full Mouth Debridement. Fluoride Application. (Bilateral) CT ABDOMEN PELVIS WITH ANESTHESIA  Patient Location: PACU  Anesthesia Type:General  Level of Consciousness: drowsy  Airway & Oxygen Therapy: Patient Spontanous Breathing and Patient connected to face mask oxygen  Post-op Assessment: Report given to RN and Post -op Vital signs reviewed and stable  Post vital signs: Reviewed and stable  Last Vitals:  Vitals Value Taken Time  BP 117/69 05/27/21 1404  Temp 36.5 C 05/27/21 1350  Pulse 64 05/27/21 1417  Resp 12 05/27/21 1417  SpO2 100 % 05/27/21 1417  Vitals shown include unvalidated device data.  Last Pain: There were no vitals filed for this visit.       Complications: No notable events documented.

## 2021-05-28 ENCOUNTER — Encounter (HOSPITAL_COMMUNITY): Payer: Self-pay | Admitting: Radiology

## 2021-05-28 NOTE — Anesthesia Postprocedure Evaluation (Signed)
Anesthesia Post Note  Patient: KURK CORNIEL  Procedure(s) Performed: Filling 14-0 Full Mouth Debridement. Fluoride Application. (Bilateral) CT ABDOMEN PELVIS WITH ANESTHESIA     Patient location during evaluation: PACU Anesthesia Type: General Level of consciousness: awake and alert Pain management: pain level controlled Vital Signs Assessment: post-procedure vital signs reviewed and stable Respiratory status: spontaneous breathing, nonlabored ventilation, respiratory function stable and patient connected to nasal cannula oxygen Cardiovascular status: blood pressure returned to baseline and stable Postop Assessment: no apparent nausea or vomiting Anesthetic complications: no   No notable events documented.  Last Vitals:  Vitals:   05/27/21 1415 05/27/21 1420  BP:  119/65  Pulse: 65 64  Resp: 13 13  Temp:  36.5 C  SpO2: 100% 100%    Last Pain: There were no vitals filed for this visit.               Sophie Tamez L Arianne Klinge

## 2021-07-06 ENCOUNTER — Other Ambulatory Visit (INDEPENDENT_AMBULATORY_CARE_PROVIDER_SITE_OTHER): Payer: Self-pay | Admitting: Family

## 2021-07-06 DIAGNOSIS — F419 Anxiety disorder, unspecified: Secondary | ICD-10-CM

## 2021-07-06 DIAGNOSIS — F840 Autistic disorder: Secondary | ICD-10-CM

## 2021-07-19 ENCOUNTER — Other Ambulatory Visit: Payer: Self-pay

## 2021-07-19 ENCOUNTER — Emergency Department (HOSPITAL_COMMUNITY): Payer: Medicare Other

## 2021-07-19 ENCOUNTER — Emergency Department (HOSPITAL_COMMUNITY)
Admission: EM | Admit: 2021-07-19 | Discharge: 2021-07-19 | Disposition: A | Discharge status: Home / Self Care | Payer: Medicare Other | Attending: Emergency Medicine | Admitting: Emergency Medicine

## 2021-07-19 DIAGNOSIS — L089 Local infection of the skin and subcutaneous tissue, unspecified: Secondary | ICD-10-CM | POA: Insufficient documentation

## 2021-07-19 DIAGNOSIS — R2241 Localized swelling, mass and lump, right lower limb: Secondary | ICD-10-CM | POA: Diagnosis present

## 2021-07-19 MED ORDER — DIAZEPAM 5 MG PO TABS
5.0000 mg | ORAL_TABLET | Freq: Once | ORAL | Status: DC
  Filled 2021-07-19: qty 1

## 2021-07-19 MED ORDER — DOXYCYCLINE CALCIUM 50 MG/5ML PO SYRP: 100.0000 mg | ORAL_SOLUTION | Freq: Two times a day (BID) | ORAL | 0 refills | Status: AC

## 2021-07-19 MED ORDER — MIDAZOLAM HCL 2 MG/2ML IJ SOLN
4.0000 mg | Freq: Once | INTRAMUSCULAR | Status: AC
  Administered 2021-07-19: 4 mg via INTRAMUSCULAR
  Filled 2021-07-19: qty 4

## 2021-07-19 MED ORDER — TETANUS-DIPHTH-ACELL PERTUSSIS 5-2.5-18.5 LF-MCG/0.5 IM SUSY
0.5000 mL | PREFILLED_SYRINGE | Freq: Once | INTRAMUSCULAR | Status: DC
  Filled 2021-07-19 (×2): qty 0.5

## 2021-07-19 NOTE — ED Notes (Signed)
Patient transported to X-ray w/ security and parents

## 2021-07-19 NOTE — ED Triage Notes (Signed)
Patient coming from home, complaint of foot injury a few nights ago, wound red and swollen

## 2021-07-19 NOTE — ED Notes (Signed)
Pt agitated in triage, not allowing anyone to touch him, pt brought to blue zone w/ security. Pt given IM versed, security and parents at bedside

## 2021-07-19 NOTE — Discharge Instructions (Signed)
He has infection versus abscess on his foot.  Warm soaks may help it.  Watch for worsening of the infection.  Antibiotics will be started.  If he develops more systemic symptoms or swelling increases return to the ER or follow-up with his doctor

## 2021-07-19 NOTE — ED Notes (Signed)
RN reviewed discharge instructions w/ pt's parents. Follow up reviewed, prescriptions reviewed, parents had no further questions.

## 2021-07-19 NOTE — ED Provider Notes (Signed)
[facility]   CSN: [id number] Arrival date & time: 07/19/21  1126     History  Chief Complaint  Patient presents with   Foot Injury    Antonio Silva is a 26 y.o. male. Level 5 caveat due to autism.  Foot Injury Patient with right foot injury.  Reportedly has chronic crack on his right foot.  Mother was examining it and patient became more agitated.  Had bruising and thinks he may have injured it.  However at this point he is too agitated to be able to examine well.  Mother showed me a picture which did show some ecchymosis in the area.  No drainage.  She did put liquid bandage on it.    Home Medications Prior to Admission medications   Medication Sig Start Date End Date Taking? Authorizing Provider  doxycycline (VIBRAMYCIN) 50 MG/5ML SYRP Take 10 mLs (100 mg total) by mouth 2 (two) times daily for 7 days. 07/19/21 07/26/21 Yes Benjiman CorePickering, Shamona Wirtz, MD  Cyanocobalamin (B-12) 5000 MCG SUBL Place 5,000 mcg under the tongue daily.    [provider]  diazepam (VALIUM) 5 MG tablet CRUSH AND GIVE 1 TABLET BY MOUTH UP TO 3 TIMES PER DAY AS NEEDED FOR ANXIETY OR AGITATION 07/06/21   Elveria RisingGoodpasture, Tina, NP  hydrOXYzine (ATARAX) 10 MG/5ML syrup TAKE 25 MLS BY MOUTH EVERY NIGHT AT BEDTIME 01/06/21   Elveria RisingGoodpasture, Tina, NP  lamotrigine (LAMICTAL) 25 MG disintegrating tablet DISSOLVE 5 TABLETS IN 1 TEASPOONFUL OF WATER AND GIVE IN JUICE TWICE DAILY 04/26/21   Elveria RisingGoodpasture, Tina, NP  Magnesium Hydroxide (DULCOLAX PO) Take 30-60 mLs by mouth daily as needed (constipation).    [provider]  magnesium hydroxide (DULCOLAX) 400 MG/5ML suspension Take by mouth daily as needed for mild constipation.    [provider]  omeprazole (PRILOSEC) 20 MG capsule TAKE 1 CAPSULE BY MOUTH TWICE A DAY BEFORE A MEAL, CAN OPEN AND PLACE IN FOOD OR DRINK 01/06/21   Elveria RisingGoodpasture, Tina, NP  OVER THE COUNTER MEDICATION Take 1 mg by mouth daily.  Triphala    [provider]  polyethylene glycol powder (GAVILAX) 17 GM/SCOOP powder TAKE 17 G BY MOUTH 3 (THREE) TIMES DAILY. 07/03/20   Elveria RisingGoodpasture, Tina, NP  risperiDONE (RISPERDAL) 1 MG/ML oral solution TAKE 2ML TWICE A DAY 01/06/21   Elveria RisingGoodpasture, Tina, NP  sertraline (ZOLOFT) 20 MG/ML concentrated solution TAKE 1.5 ML IN THE MORNING 01/06/21   Elveria RisingGoodpasture, Tina, NP      Allergies    Ketamine and Other    Review of Systems   Review of Systems  Unable to perform ROS: Psychiatric disorder   Physical Exam Updated Vital Signs BP (!) 148/91 (BP Location: Left Arm)    Pulse (!) 120    Resp 20    SpO2 96%  Physical Exam Vitals reviewed.  Constitutional:      Appearance: Normal appearance.  Pulmonary:     Effort: No respiratory distress.  Skin:    Capillary Refill: Capillary refill takes less than 2 seconds.     Comments: Base of the right foot has a crack.  There is fluctuance in this area.  No purulence.  No surrounding erythema.  Does have liquid bandage over it.  No erythema to dorsum of foot.  Neurological:     Mental Status: He is alert.     Comments: Agitated but otherwise at baseline.    ED Results / Procedures / Treatments   Labs (all  labs ordered are listed, but only abnormal results are displayed) Labs Reviewed - No data to display  EKG None  Radiology DG Foot Complete Right  Result Date: 07/19/2021 CLINICAL DATA:  Status post injury to right foot a few days ago with red and swollen anterior right foot. EXAM: RIGHT FOOT COMPLETE - 3+ VIEW COMPARISON:  None. FINDINGS: There is no evidence of fracture or dislocation. There is no evidence of arthropathy or other focal bone abnormality. Soft tissues are unremarkable. IMPRESSION: Negative. Electronically Signed   By: Sherian Rein M.D.   On: 07/19/2021 13:01    Procedures Procedures    Medications Ordered in ED Medications  Tdap (BOOSTRIX) injection 0.5 mL (0.5 mLs Intramuscular Patient Refused/Not Given  07/19/21 1310)  diazepam (VALIUM) tablet 5 mg (5 mg Oral Patient Refused/Not Given 07/19/21 1259)  midazolam (VERSED) injection 4 mg (4 mg Intramuscular Given 07/19/21 1202)    ED Course/ Medical Decision Making/ A&P                           Medical Decision Making  This patient presents to the ED for concern of foot injury versus infection, this involves an extensive number of treatment options, and is a complaint that carries with it a high risk of complications and morbidity.  The differential diagnosis includes cellulitis/abscess/fracture.   Co morbidities that complicate the patient evaluation  Severe autism   Additional history obtained:  Additional history obtained from mother     Imaging Studies ordered:  I ordered imaging studies including foot x-ray I independently visualized and interpreted imaging which showed swelling without fracture. I agree with the radiologist interpretation      Medicines ordered and prescription drug management:  I ordered medication including Versed for sedation Reevaluation of the patient after these medicines showed that the patient stayed the same I have reviewed the patients home medicines and have made adjustments as needed   Test Considered:  CBC basic metabolic but not done due to patient agitation.    Consultations Obtained:  I requested consultation with the pharmacist,  and discussed lab and imaging findings as well as pertinent plan - they recommend: Antibiotics   Problem List / ED Course:  Patient with foot complaint.  Crack which is chronic on sole of foot with some surrounding swelling.  No frank erythema but is tender.  No other erythema on the foot.  X-ray done reassuring.  Potentially could be cellulitis/abscess.  Ideally would try a debridement but with patient's underlying autism would require sedation for this.  I think right now the risk of the sedation would outweigh the benefit from it.  We will do warm  soaks at home.  Patient's mother states that he likes baths and should be able to tolerate that.  We will also start antibiotics to cover if it were cellulitis versus abscess.  If worsens will return to ER admit potentially could require more sedation for further treatment/evaluation.  At this point will discharge home. Even after IM Versed did not Tolerate exam.   Reevaluation:  After the interventions noted above, I reevaluated the patient and found that they have :improved   Social Determinants of Health:  Autism   Dispostion:  After consideration of the diagnostic results and the patients response to treatment, I feel that the patent would benefit from oral antibiotics and recheck..         Final Clinical Impression(s) / ED Diagnoses Final diagnoses:  Right foot infection    Rx / DC Orders ED Discharge Orders          Ordered    doxycycline (VIBRAMYCIN) 50 MG/5ML SYRP  2 times daily        07/19/21 1316              Benjiman Core, MD 07/19/21 1329

## 2021-10-04 ENCOUNTER — Encounter (INDEPENDENT_AMBULATORY_CARE_PROVIDER_SITE_OTHER): Payer: Self-pay

## 2021-10-04 DIAGNOSIS — G40309 Generalized idiopathic epilepsy and epileptic syndromes, not intractable, without status epilepticus: Secondary | ICD-10-CM

## 2021-10-04 MED ORDER — VALTOCO 20 MG DOSE 10 MG/0.1ML NA LQPK: NASAL | 5 refills | Status: DC

## 2021-10-04 MED ORDER — LAMOTRIGINE 25 MG PO TBDP: ORAL_TABLET | ORAL | 5 refills | Status: DC

## 2021-10-12 MED ORDER — VALTOCO 20 MG DOSE 10 MG/0.1ML NA LQPK: NASAL | 5 refills | Status: DC

## 2021-10-12 NOTE — Addendum Note (Signed)
Addended by: Princella Ion on: 10/12/2021 12:01 PM ? ? Modules accepted: Orders ? ?

## 2021-10-26 ENCOUNTER — Encounter (INDEPENDENT_AMBULATORY_CARE_PROVIDER_SITE_OTHER): Payer: Self-pay

## 2021-11-15 ENCOUNTER — Ambulatory Visit: Payer: Medicaid Other | Admitting: Family Medicine

## 2021-12-17 ENCOUNTER — Encounter (INDEPENDENT_AMBULATORY_CARE_PROVIDER_SITE_OTHER): Payer: Self-pay

## 2021-12-17 DIAGNOSIS — F419 Anxiety disorder, unspecified: Secondary | ICD-10-CM

## 2021-12-17 DIAGNOSIS — F840 Autistic disorder: Secondary | ICD-10-CM

## 2022-01-10 ENCOUNTER — Other Ambulatory Visit (INDEPENDENT_AMBULATORY_CARE_PROVIDER_SITE_OTHER): Payer: Self-pay | Admitting: Family

## 2022-01-10 DIAGNOSIS — K219 Gastro-esophageal reflux disease without esophagitis: Secondary | ICD-10-CM

## 2022-01-10 MED ORDER — DIAZEPAM 5 MG PO TABS: ORAL_TABLET | ORAL | 5 refills | Status: DC

## 2022-01-30 NOTE — Progress Notes (Unsigned)
This is a Pediatric Specialist E-Visit consult/follow up provided via My Chart Antonio Silva and their parent/guardian *** (name of consenting adult) consented to an E-Visit consult today.  Location of patient: Antonio Silva is at *** (location) Location of provider: Carlyle Lipa is at *** (location) Patient was referred by Antonio Mis, MD   The following participants were involved in this E-Visit: *** (list of participants and their roles)  This visit was done via VIDEO   Chief Complain/ Reason for E-Visit today: *** Total time on call: *** Follow up: ***      Antonio Silva   MRN:  [id number]  07/15/1996   Provider: [name] NP-C Location of Care: 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 He is taking and tolerating Lamotrigine and has remained seizure free since April 2020 when he had a seizure in the setting of missed medication. He has ongoing anxiety that is treated with Sertraline. He also takes Hydroxyzine for insomnia, Diazepam as needed for restless behavior and Risperidone for agitation and aggressive behavior.pied from previous record:   Today's concerns:  *** has been otherwise generally healthy since he was last seen. Neither *** nor mother have other health concerns for *** today other than previously mentioned.   Review of systems: Please see HPI for neurologic and other pertinent review of systems. Otherwise all other systems were reviewed and were negative.  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 11 oz. infant born at full-term to a 110 year old primigravida.   Mother gained 30 pounds   Labor lasted for 10 hours   Normal spontaneous vaginal delivery   Delayed developmental milestones particularly for language.   Behavior History autism spectrum disorder, restless, agitated, self-injurious  Surgical history: Past Surgical History:  Procedure Laterality Date   DENTAL RESTORATION/EXTRACTION WITH X-RAY Bilateral 05/27/2021   Procedure: Filling 14-0 Full Mouth Debridement. Fluoride Application.;  Surgeon: Joanna Hews, DMD;  Location: [facility] OR;  Service: Dentistry;  Laterality: Bilateral;   FECAL DISIMPACTION  2018   RADIOLOGY WITH ANESTHESIA N/A 05/27/2021   Procedure: CT ABDOMEN PELVIS WITH ANESTHESIA;  Surgeon: Radiologist, Medication, MD;  Location: [facility] OR;  Service: Radiology;  Laterality: N/A;   WISDOM TOOTH EXTRACTION     age 46     Family history: family history includes Asthma  in his mother; Breast cancer in his paternal grandmother; Depression in his mother; Diabetes in his mother; Heart attack in his maternal grandfather and maternal grandmother; Hyperlipidemia  in his mother; Hypertension in his mother; Ulcerative colitis in his father.   Social history: Social History   Socioeconomic History   Marital status: Single    Spouse name: Not on file   Number of children: Not on file   Years of education: Not on file   Highest education level: Not on file  Occupational History   Not on file  Tobacco Use   Smoking status: Never   Smokeless tobacco: Never  Vaping Use   Vaping Use: Never used  Substance and Sexual Activity   Alcohol use: Never   Drug use: Never   Sexual activity: Never  Other Topics Concern   Not on file  Social History Narrative   [name] is 26 yo male.   He has caregiver and he goes to their home.    He lives with both parents and has no siblings.    He enjoys attending his day program and walking   Social Determinants of Health   Financial Resource Strain: Not on file  Food Insecurity: Not on file  Transportation Needs: Not on file  Physical Activity: Not on file  Stress: Not on file  Social Connections: Not on file  Intimate Partner Violence: Not on file    Past/failed meds:  Allergies: Allergies  Allergen Reactions   Ketamine Other (See Comments)    CAUSES SEIZURES    Other     Lactose    Immunizations: Immunization History  Administered Date(s) Administered   Influenza,inj,Quad PF,6+ Mos 05/28/2014, 05/18/2015, 05/19/2016, 05/22/2019   PFIZER(Purple Top)SARS-COV-2 Vaccination 09/25/2019, 10/16/2019, 04/28/2020   Tdap 11/21/2017    Diagnostics/Screenings:  Physical Exam: There were no vitals taken for this visit.  General: well developed, well nourished, seated, in no evident distress Head: normocephalic and atraumatic. No dysmorphic features. Neck: supple Musculoskeletal: no skeletal deformities or obvious scoliosis. Skin: no rashes or neurocutaneous lesions  Neurologic Exam Mental Status: awake and fully alert. Has no language.  Smiles responsively at times. Unable to follow commands or  participate in examination Cranial Nerves: turns to localize faces and objects in the periphery. Turns to localize sounds in the periphery. Facial movements are symmetric Motor: normal functional bulk, tone and strength Sensory: withdrawal x 4 Coordination: unable to adequately assess due to patient's inability to participate in examination. No dysmetria when reaching for objects. Gait and Station: stance and gait are normal   Impression: No diagnosis found.    Recommendations for plan of care: The patient's previous Epic records were reviewed. [name] has neither had nor required imaging or lab studies since the last visit.   The medication list was reviewed and reconciled. No changes were made in the prescribed medications today. A complete medication list was provided to the patient.  No orders of the defined types were placed in this encounter.   No follow-ups on file.   Allergies as of 01/31/2022       Reactions   Ketamine Other (See Comments)   CAUSES SEIZURES    Other    Lactose        Medication List        Accurate as of January 30, 2022 10:05 PM. If you have any questions, ask your nurse or doctor.          B-12 5000 MCG Subl Place 5,000 mcg under the  tongue daily.   diazepam 5 MG tablet Commonly known as: VALIUM CRUSH AND GIVE 1 TABLET BY MOUTH UP TO 3 TIMES PER DAY AS NEEDED FOR ANXIETY OR AGITATION   Dulcolax 400 MG/5ML suspension Generic drug: magnesium hydroxide Take by mouth daily as needed for mild constipation.   DULCOLAX PO Take 30-60 mLs by mouth daily as needed (constipation).   hydrOXYzine 10 MG/5ML syrup Commonly known as: ATARAX TAKE 25 MLS BY MOUTH EVERY NIGHT AT BEDTIME   lamotrigine 25 MG disintegrating tablet Commonly known as: LAMICTAL DISSOLVE 6 TABLETS IN 1 TEASPOONFUL OF WATER AND GIVE IN JUICE TWICE DAILY   omeprazole 20 MG capsule Commonly known as: PRILOSEC TAKE 1 CAPSULE BY MOUTH TWICE A DAY BEFORE A MEAL, CAN OPEN AND  PLACE IN FOOD OR DRINK   OVER THE COUNTER MEDICATION Take 1 mg by mouth daily. Triphala   polyethylene glycol powder 17 GM/SCOOP powder Commonly known as: GaviLAX TAKE 17 G BY MOUTH 3 (THREE) TIMES DAILY.   risperiDONE 1 MG/ML oral solution Commonly known as: RISPERDAL TAKE TWICE A DAY   sertraline 20 MG/ML concentrated solution Commonly known as: ZOLOFT TAKE 1.5 ML IN THE MORNING   Valtoco 20 MG Dose 2 x 10 MG/0.1ML Lqpk Generic drug: diazePAM (20 MG Dose) Give 1 spray into each nostril at the onset of seizure            I discussed this patient's care with the multiple providers involved in his care today to develop this assessment and plan.   Total time spent with the patient was *** minutes, of which 50% or more was spent in counseling and coordination of care.  [name] NP-C [facility] Ph. [phone] Fax [phone]

## 2022-01-31 ENCOUNTER — Telehealth (INDEPENDENT_AMBULATORY_CARE_PROVIDER_SITE_OTHER): Payer: Medicare Other | Admitting: Family

## 2022-01-31 ENCOUNTER — Encounter (INDEPENDENT_AMBULATORY_CARE_PROVIDER_SITE_OTHER): Payer: Self-pay | Admitting: Family

## 2022-01-31 VITALS — Wt 175.0 lb

## 2022-01-31 DIAGNOSIS — G40309 Generalized idiopathic epilepsy and epileptic syndromes, not intractable, without status epilepticus: Secondary | ICD-10-CM

## 2022-01-31 DIAGNOSIS — K219 Gastro-esophageal reflux disease without esophagitis: Secondary | ICD-10-CM

## 2022-01-31 DIAGNOSIS — K5909 Other constipation: Secondary | ICD-10-CM

## 2022-01-31 DIAGNOSIS — F840 Autistic disorder: Secondary | ICD-10-CM | POA: Diagnosis not present

## 2022-01-31 DIAGNOSIS — F802 Mixed receptive-expressive language disorder: Secondary | ICD-10-CM

## 2022-01-31 DIAGNOSIS — G4701 Insomnia due to medical condition: Secondary | ICD-10-CM

## 2022-01-31 DIAGNOSIS — F419 Anxiety disorder, unspecified: Secondary | ICD-10-CM

## 2022-01-31 NOTE — Patient Instructions (Signed)
It was a pleasure to see you today!  Instructions for you until your next appointment are as follows: Continue Jamie's medications as prescribed Let me know if he has any seizures or if you have any concerns. Please sign up for MyChart if you have not done so. Please plan to return for follow up in one year or sooner if needed.  Feel free to contact our office during normal business hours at [phone] with questions or concerns. If there is no answer or the call is outside business hours, please leave a message and our clinic staff will call you back within the next business day.  If you have an urgent concern, please stay on the line for our after-hours answering service and ask for the on-call neurologist.     I also encourage you to use MyChart to communicate with me more directly. If you have not yet signed up for MyChart within [facility], the front desk staff can help you. However, please note that this inbox is NOT monitored on nights or weekends, and response can take up to 2 business days.  Urgent matters should be discussed with the on-call pediatric neurologist.   At [facility], we are committed to providing exceptional care. You will receive a patient satisfaction survey through text or email regarding your visit today. Your opinion is important to me. Comments are appreciated.

## 2022-02-18 ENCOUNTER — Encounter (INDEPENDENT_AMBULATORY_CARE_PROVIDER_SITE_OTHER): Payer: Self-pay

## 2022-02-18 MED ORDER — LAMOTRIGINE 200 MG PO TBDP: ORAL_TABLET | ORAL | 5 refills | Status: DC

## 2022-02-22 ENCOUNTER — Encounter (INDEPENDENT_AMBULATORY_CARE_PROVIDER_SITE_OTHER): Payer: Self-pay

## 2022-02-23 ENCOUNTER — Encounter (INDEPENDENT_AMBULATORY_CARE_PROVIDER_SITE_OTHER): Payer: Self-pay

## 2022-03-02 ENCOUNTER — Other Ambulatory Visit (INDEPENDENT_AMBULATORY_CARE_PROVIDER_SITE_OTHER): Payer: Self-pay | Admitting: Family

## 2022-03-02 DIAGNOSIS — G40309 Generalized idiopathic epilepsy and epileptic syndromes, not intractable, without status epilepticus: Secondary | ICD-10-CM

## 2022-03-02 DIAGNOSIS — K219 Gastro-esophageal reflux disease without esophagitis: Secondary | ICD-10-CM

## 2022-03-23 ENCOUNTER — Telehealth (INDEPENDENT_AMBULATORY_CARE_PROVIDER_SITE_OTHER): Payer: Self-pay | Admitting: Family

## 2022-03-23 ENCOUNTER — Telehealth (INDEPENDENT_AMBULATORY_CARE_PROVIDER_SITE_OTHER): Payer: Self-pay | Admitting: Pediatrics

## 2022-03-23 NOTE — Telephone Encounter (Signed)
I called and left a message asking for more information. TG

## 2022-03-23 NOTE — Telephone Encounter (Signed)
Received page from [facility] regarding Antonio Silva's medication. Provider at [facility] needing updated medication order and order for a prior dosage of lamotrigine. She can be reached by phone at [phone] with questions. Information to be faxed to [phone].

## 2022-03-23 NOTE — Telephone Encounter (Signed)
Received via Fax from Access Nurse nonclinical telephone record Callers states she needs a updates medication order and order for a prior dosage. Caller states her fax number is [phone]. The RX is Lamotriginie 25 ml to 200.  Facility name is [facility] I am placing this in your box.

## 2022-03-24 NOTE — Telephone Encounter (Signed)
I left another message requesting a call back. TG

## 2022-03-24 NOTE — Telephone Encounter (Signed)
See other phone message about this issue. TG

## 2022-04-06 ENCOUNTER — Other Ambulatory Visit (INDEPENDENT_AMBULATORY_CARE_PROVIDER_SITE_OTHER): Payer: Self-pay | Admitting: Family

## 2022-04-06 NOTE — Telephone Encounter (Signed)
I called the pharmacy. The manufacturer will be different for this refill of Lamotrigine ODT. I approved that and called Mom to let her know. TG

## 2022-04-25 ENCOUNTER — Telehealth (INDEPENDENT_AMBULATORY_CARE_PROVIDER_SITE_OTHER): Payer: Self-pay | Admitting: Family

## 2022-04-25 NOTE — Telephone Encounter (Signed)
I will do this Weds when I get back to the office. TG

## 2022-04-25 NOTE — Telephone Encounter (Signed)
  Name of who is calling: Lorelei Pont with [facility]   Caller's Relationship to Patient:  Best contact number: [phone]  Provider they see: Rockwell Germany  Reason for call: Received completed MAR medication order form, but she needs the routes per medication put on the orders.      PRESCRIPTION REFILL ONLY  Name of prescription:  Pharmacy:

## 2022-04-27 NOTE — Telephone Encounter (Signed)
I called and left a message requesting a call back or for the form to be faxed to me to complete. TG

## 2022-05-05 NOTE — Telephone Encounter (Signed)
[facility] has not called back. TG

## 2022-08-23 ENCOUNTER — Encounter (INDEPENDENT_AMBULATORY_CARE_PROVIDER_SITE_OTHER): Payer: Self-pay

## 2022-08-30 ENCOUNTER — Encounter (INDEPENDENT_AMBULATORY_CARE_PROVIDER_SITE_OTHER): Payer: Self-pay

## 2022-09-01 ENCOUNTER — Other Ambulatory Visit (INDEPENDENT_AMBULATORY_CARE_PROVIDER_SITE_OTHER): Payer: Self-pay | Admitting: Family

## 2022-09-01 DIAGNOSIS — K219 Gastro-esophageal reflux disease without esophagitis: Secondary | ICD-10-CM

## 2022-09-14 ENCOUNTER — Encounter (INDEPENDENT_AMBULATORY_CARE_PROVIDER_SITE_OTHER): Payer: Self-pay

## 2022-09-15 NOTE — Telephone Encounter (Signed)
The letter was written and mailed. TG

## 2022-09-19 ENCOUNTER — Encounter (INDEPENDENT_AMBULATORY_CARE_PROVIDER_SITE_OTHER): Payer: Self-pay

## 2022-10-28 ENCOUNTER — Telehealth (INDEPENDENT_AMBULATORY_CARE_PROVIDER_SITE_OTHER): Payer: Self-pay | Admitting: Family

## 2022-10-28 NOTE — Telephone Encounter (Signed)
Argentina Donovan from [facility] calling in regards to pts medication- Hydroxyzine, she said if pt is no longer taking the medication she needs a discontinue order sent over to her.   [facility] Clyde Canterbury- [phone] F- [phone]

## 2022-10-31 NOTE — Addendum Note (Signed)
Addended by: Princella Ion on: 10/31/2022 10:14 AM   Modules accepted: Orders

## 2022-10-31 NOTE — Telephone Encounter (Signed)
I wrote a note to discontinue the Hydroxyzine and sent it to [facility] as requested. TG

## 2023-02-02 ENCOUNTER — Emergency Department (HOSPITAL_BASED_OUTPATIENT_CLINIC_OR_DEPARTMENT_OTHER)
Admission: EM | Admit: 2023-02-02 | Discharge: 2023-02-02 | Disposition: A | Discharge status: Home / Self Care | Payer: 59 | Source: Home / Self Care | Attending: Emergency Medicine | Admitting: Emergency Medicine

## 2023-02-02 ENCOUNTER — Other Ambulatory Visit: Payer: Self-pay

## 2023-02-02 ENCOUNTER — Encounter (HOSPITAL_BASED_OUTPATIENT_CLINIC_OR_DEPARTMENT_OTHER): Payer: Self-pay | Admitting: Emergency Medicine

## 2023-02-02 ENCOUNTER — Emergency Department (HOSPITAL_BASED_OUTPATIENT_CLINIC_OR_DEPARTMENT_OTHER): Payer: 59

## 2023-02-02 DIAGNOSIS — R1084 Generalized abdominal pain: Secondary | ICD-10-CM | POA: Insufficient documentation

## 2023-02-02 DIAGNOSIS — K5909 Other constipation: Secondary | ICD-10-CM | POA: Diagnosis not present

## 2023-02-02 DIAGNOSIS — F840 Autistic disorder: Secondary | ICD-10-CM | POA: Insufficient documentation

## 2023-02-02 DIAGNOSIS — R000 Tachycardia, unspecified: Secondary | ICD-10-CM | POA: Insufficient documentation

## 2023-02-02 DIAGNOSIS — R4689 Other symptoms and signs involving appearance and behavior: Secondary | ICD-10-CM | POA: Diagnosis not present

## 2023-02-02 DIAGNOSIS — K5981 Ogilvie syndrome: Secondary | ICD-10-CM | POA: Diagnosis not present

## 2023-02-02 LAB — CBC
HCT: 45 % (ref 39.0–52.0)
Hemoglobin: 16.1 g/dL (ref 13.0–17.0)
MCH: 33.2 pg (ref 26.0–34.0)
MCHC: 35.8 g/dL (ref 30.0–36.0)
MCV: 92.8 fL (ref 80.0–100.0)
Platelets: 202 10*3/uL (ref 150–400)
RBC: 4.85 MIL/uL (ref 4.22–5.81)
RDW: 11.8 % (ref 11.5–15.5)
WBC: 7.9 10*3/uL (ref 4.0–10.5)
nRBC: 0 % (ref 0.0–0.2)

## 2023-02-02 LAB — BASIC METABOLIC PANEL
Anion gap: 12 (ref 5–15)
BUN: 8 mg/dL (ref 6–20)
CO2: 23 mmol/L (ref 22–32)
Calcium: 9.7 mg/dL (ref 8.9–10.3)
Chloride: 103 mmol/L (ref 98–111)
Creatinine, Ser: 0.93 mg/dL (ref 0.61–1.24)
GFR, Estimated: >60 mL/min (ref >60)
Glucose, Bld: 97 mg/dL (ref 70–99)
Potassium: 4.5 mmol/L (ref 3.5–5.1)
Sodium: 138 mmol/L (ref 135–145)

## 2023-02-02 LAB — COMPREHENSIVE METABOLIC PANEL
ALT: 18 U/L (ref 0–44)
AST: 21 U/L (ref 15–41)
Albumin: 5.2 g/dL — ABNORMAL HIGH (ref 3.5–5.0)
Alkaline Phosphatase: 70 U/L (ref 38–126)
Anion gap: 17 — ABNORMAL HIGH (ref 5–15)
BUN: 9 mg/dL (ref 6–20)
CO2: 17 mmol/L — ABNORMAL LOW (ref 22–32)
Calcium: 9.9 mg/dL (ref 8.9–10.3)
Chloride: 103 mmol/L (ref 98–111)
Creatinine, Ser: 1.03 mg/dL (ref 0.61–1.24)
GFR, Estimated: >60 mL/min (ref >60)
Glucose, Bld: 149 mg/dL — ABNORMAL HIGH (ref 70–99)
Potassium: 4.1 mmol/L (ref 3.5–5.1)
Sodium: 137 mmol/L (ref 135–145)
Total Bilirubin: 1.7 mg/dL — ABNORMAL HIGH (ref 0.3–1.2)
Total Protein: 7.8 g/dL (ref 6.5–8.1)

## 2023-02-02 LAB — LIPASE, BLOOD: Lipase: 14 U/L (ref 11–51)

## 2023-02-02 MED ORDER — MIDAZOLAM HCL 2 MG/2ML IJ SOLN
2.0000 mg | Freq: Once | INTRAMUSCULAR | Status: AC
  Administered 2023-02-02: 2 mg via INTRAVENOUS
  Filled 2023-02-02: qty 2

## 2023-02-02 MED ORDER — MIDAZOLAM HCL 2 MG/2ML IJ SOLN
4.0000 mg | Freq: Once | INTRAMUSCULAR | Status: AC
  Administered 2023-02-02: 4 mg via INTRAVENOUS
  Filled 2023-02-02: qty 4

## 2023-02-02 MED ORDER — IOHEXOL 300 MG/ML  SOLN
100.0000 mL | Freq: Once | INTRAMUSCULAR | Status: AC | PRN
  Administered 2023-02-02: 85 mL via INTRAVENOUS

## 2023-02-02 MED ORDER — LACTATED RINGERS IV BOLUS
1000.0000 mL | Freq: Once | INTRAVENOUS | Status: AC
  Administered 2023-02-02: 1000 mL via INTRAVENOUS

## 2023-02-02 MED ORDER — LACTATED RINGERS IV BOLUS
500.0000 mL | Freq: Once | INTRAVENOUS | Status: AC
  Administered 2023-02-02: 500 mL via INTRAVENOUS

## 2023-02-02 NOTE — ED Triage Notes (Signed)
Pt arrived POV from home with parents. Pt's parent's advised that pt has PMH severe autism, is nonverbal, and has chronic GI issues and has been crying and restless c/o abd pain for the past 2 days. LBM was Saturday, has been taking Miralax 4x/day with no improvement.

## 2023-02-02 NOTE — ED Provider Notes (Signed)
[place] EMERGENCY DEPARTMENT AT [facility] Provider Note   CSN: [id number] Arrival date & time: 02/02/23  7829     History  Chief Complaint  Patient presents with   Abdominal Pain    Antonio Silva is a 27 y.o. male.  HPI Patient is nonverbal history obtained from parents and aid 27 year old male history of autism and developmental delay, seizure disorder, presents today with reports of "GI issues".  Mother reports that patient has had problems having bowel movements.  Even after he has "a blowout".  He holds his hands over his upper abdomen and seems to have discomfort.  He has had decreased p.o. intake.  They describe more concentrated urine.  No report of recent seizures and patient has been taking medications orally.  He received 10 mg of Valium prior to coming to the ED Parents describe chronic constipation and giving him MiraLAX 4 times per day.    Home Medications Prior to Admission medications   Medication Sig Start Date End Date Taking? Authorizing Provider  Cyanocobalamin (B-12) 5000 MCG SUBL Place 5,000 mcg under the tongue daily.    [provider]  diazepam (VALIUM) 5 MG tablet CRUSH AND GIVE 1 TABLET BY MOUTH UP TO 3 TIMES PER DAY AS NEEDED FOR ANXIETY OR AGITATION Patient not taking: Reported on 01/31/2022 01/10/22   [name], NP  lamoTRIgine 200 MG TBDP DISSOLVE 1 TABLET IN 1 TEASPOON OF WATER AND GIVE ONCE IN THE MORNING AND ONCE AT NIGHT 09/01/22   [name], NP  omeprazole (PRILOSEC) 20 MG capsule TAKE 1 CAPSULE BY MOUTH TWICE A DAY BEFORE A MEAL, CAN OPEN AND PLACE IN FOOD OR DRINK 09/01/22   Keturah Shavers, MD  OVER THE COUNTER MEDICATION Take 1 mg by mouth daily. Triphala    [provider]  polyethylene glycol powder (GAVILAX) 17 GM/SCOOP powder TAKE 17 G BY MOUTH 3 (THREE) TIMES DAILY. 07/03/20   [name], NP  risperiDONE (RISPERDAL) 1 MG/ML oral solution TAKE TWICE A DAY 01/06/21   [name],  NP  sertraline (ZOLOFT) 20 MG/ML concentrated solution TAKE 1.5 ML IN THE MORNING 01/06/21   [name], NP  VALTOCO 20 MG DOSE 10 MG/0.1ML LQPK Give 1 spray into each nostril at the onset of seizure Patient not taking: Reported on 01/31/2022 10/12/21   [name], NP      Allergies    Ketamine and Other    Review of Systems   Review of Systems  Physical Exam Updated Vital Signs BP 123/84   Pulse 89   Temp 97.9 F (36.6 C) (Tympanic)   Resp 18   SpO2 98%  Physical Exam Vitals and nursing note reviewed.  HENT:     Head: Normocephalic.     Mouth/Throat:     Mouth: Mucous membranes are moist.  Eyes:     Extraocular Movements: Extraocular movements intact.  Cardiovascular:     Rate and Rhythm: Regular rhythm. Tachycardia present.  Pulmonary:     Effort: Pulmonary effort is normal.     Breath sounds: Normal breath sounds.  Abdominal:     General: Abdomen is flat. Bowel sounds are decreased.     Palpations: Abdomen is soft.     Tenderness: There is no abdominal tenderness.  Genitourinary:    Testes: Normal.     Comments: Visually examined genitalia without any signs of abnormality exam limited due to patient's capacity and autism Patient rolled on slime attempted to visualize rectum but unable to  perform rectal exam Skin:    General: Skin is warm and dry.     Capillary Refill: Capillary refill takes less than 2 seconds.  Neurological:     General: No focal deficit present.     Mental Status: He is alert.     ED Results / Procedures / Treatments   Labs (all labs ordered are listed, but only abnormal results are displayed) Labs Reviewed  COMPREHENSIVE METABOLIC PANEL - Abnormal; Notable for the following components:      Result Value   CO2 17 (*)    Glucose, Bld 149 (*)    Albumin 5.2 (*)    Total Bilirubin 1.7 (*)    Anion gap 17 (*)    All other components within normal limits  CBC  LIPASE, BLOOD  BASIC METABOLIC PANEL  URINALYSIS, ROUTINE W  REFLEX MICROSCOPIC    EKG None  Radiology CT ABDOMEN PELVIS W CONTRAST  Result Date: 02/02/2023 CLINICAL DATA:  Abdominal pain EXAM: CT ABDOMEN AND PELVIS WITH CONTRAST TECHNIQUE: Multidetector CT imaging of the abdomen and pelvis was performed using the standard protocol following bolus administration of intravenous contrast. RADIATION DOSE REDUCTION: This exam was performed according to the departmental dose-optimization program which includes automated exposure control, adjustment of the mA and/or kV according to patient size and/or use of iterative reconstruction technique. CONTRAST:  85mL OMNIPAQUE IOHEXOL 300 MG/ML  SOLN COMPARISON:  05/27/2021 FINDINGS: Lower chest: Visualized lower lung fields are clear. There is no pleural effusion. Hepatobiliary: No focal abnormalities are seen in liver. There is no dilation of bile ducts. Gallbladder is unremarkable. Pancreas: No focal abnormalities are seen. Spleen: Unremarkable. Adrenals/Urinary Tract: Adrenals are unremarkable. There is no hydronephrosis. There are no renal or ureteral stones. Urinary bladder is distended. Stomach/Bowel: Small hiatal hernia is seen. Stomach is not distended. Small bowel loops are not dilated. The appendix is not seen. There is no pericecal inflammation. Liquid stool is noted in colon and rectum. Large amount of stool is seen in colon, more so in the left colon and rectum. Sigmoid colon measures up to 8.1 cm in diameter. Rectum measures 9 cm in transverse diameter. There is no significant wall thickening in colon and rectum. There is no pericolic stranding. Vascular/Lymphatic: Unremarkable. Reproductive: Unremarkable. Other: There is no ascites or pneumoperitoneum. Small umbilical hernia containing fat is seen. Musculoskeletal: No acute findings are seen. IMPRESSION: Large amount of liquid stool is seen in colon, more so in left colon and rectum. This may suggest motility dysfunction. There is no wall thickening in colon. There  is no pericolic stranding or fluid collection. There is no evidence of small-bowel obstruction. There is no hydronephrosis. Small hiatal hernia. Electronically Signed   By: Ernie Avena M.D.   On: 02/02/2023 13:03    Procedures Procedures    Medications Ordered in ED Medications  lactated ringers bolus 1,000 mL (0 mLs Intravenous Stopped 02/02/23 1157)  midazolam (VERSED) injection 4 mg (4 mg Intravenous Given 02/02/23 1056)  iohexol (OMNIPAQUE) 300 MG/ML solution 100 mL (85 mLs Intravenous Contrast Given 02/02/23 1242)  lactated ringers bolus 500 mL (0 mLs Intravenous Stopped 02/02/23 1454)  midazolam (VERSED) injection 2 mg (2 mg Intravenous Given 02/02/23 1415)    ED Course/ Medical Decision Making/ A&P Clinical Course as of 02/02/23 1542  Thu Feb 02, 2023  1357 CBC reviewed interpreted and within normal limits Lipase reviewed interpreted within normal limits Complete metabolic panel is reviewed and interpreted significant for CO2 decreased at 17 and total bili  slightly elevated at 1.7 glucose is elevated at 149.  Anion gap is 17 [DR]  1537 Repeat metabolic panel with normalization of CO2 increased to 23 and glucose decreased to 97. [DR]    Clinical Course User Index [DR] Margarita Grizzle, MD                             Medical Decision Making Amount and/or Complexity of Data Reviewed Labs: ordered. Radiology: ordered.  Risk Prescription drug management.   27 year old male presents today with reports of upper chronic abdominal pain.  Per his parents report he has been having loose stools but still appears to have crampy abdominal pain and has had decreased p.o. intake. Patient evaluated here with labs and imaging.  Do not have point tenderness, but due to patient's decreased ability to communicate, felt that imaging was indicated. CT shows fluid stool throughout colon but no wall thickening, pericolic stranding, or fluid collection.  Appendix was not visualized.  Gallbladder  was unremarkable.  There is mildly elevated bilirubin. Patient has received increased dose of polyethylene glycol over the past several days due to concerns for patient.  Suspect that this has caused increased fluid into his gut.  I have discussed with his parents that they should decrease the polyethylene glycol over the next 2 days to 9 and then began gradually along with bulking agents..  We have discussed return precautions that include fever, inability to tolerate liquids, or other worsening symptoms. Patient tolerated experienced well with 2 doses of Versed here in the ED.  Parents were informed of same.       Final Clinical Impression(s) / ED Diagnoses Final diagnoses:  Generalized abdominal pain    Rx / DC Orders ED Discharge Orders     None         Margarita Grizzle, MD 02/02/23 1542

## 2023-02-02 NOTE — ED Notes (Signed)
Patient transported to CT 

## 2023-02-02 NOTE — ED Notes (Signed)
Discharge instructions and follow up care reviewed and explained to pt's parents and caregiver who verbalized understanding and had no further questions on d/c. Pt alert and ambulatory on d/c.

## 2023-02-02 NOTE — Discharge Instructions (Addendum)
Please stop polyethylene glycol for the next 2 days and then gradually restart until patient has normal stools. Please give him Gatorade mixed with water for oral hydration.  Please use blue or yellow, do not use red. Return if he is not tolerating liquids, seems worse, has fever or other new symptoms. Please call the gastroenterologist for follow-up. Please call his primary care physician for follow-up.

## 2023-02-05 ENCOUNTER — Other Ambulatory Visit: Payer: Self-pay

## 2023-02-05 ENCOUNTER — Inpatient Hospital Stay (HOSPITAL_COMMUNITY)
Admission: EM | Admit: 2023-02-05 | Discharge: 2023-02-09 | DRG: 392 | Disposition: A | Discharge status: Home / Self Care | Payer: 59 | Attending: Emergency Medicine | Admitting: Emergency Medicine

## 2023-02-05 ENCOUNTER — Encounter (HOSPITAL_COMMUNITY): Payer: Self-pay

## 2023-02-05 ENCOUNTER — Emergency Department (HOSPITAL_COMMUNITY): Payer: 59

## 2023-02-05 DIAGNOSIS — F840 Autistic disorder: Secondary | ICD-10-CM | POA: Diagnosis present

## 2023-02-05 DIAGNOSIS — R1310 Dysphagia, unspecified: Secondary | ICD-10-CM | POA: Diagnosis present

## 2023-02-05 DIAGNOSIS — R451 Restlessness and agitation: Secondary | ICD-10-CM | POA: Diagnosis present

## 2023-02-05 DIAGNOSIS — Z888 Allergy status to other drugs, medicaments and biological substances status: Secondary | ICD-10-CM | POA: Diagnosis not present

## 2023-02-05 DIAGNOSIS — Z781 Physical restraint status: Secondary | ICD-10-CM

## 2023-02-05 DIAGNOSIS — R4587 Impulsiveness: Secondary | ICD-10-CM | POA: Diagnosis present

## 2023-02-05 DIAGNOSIS — Z91011 Allergy to milk products: Secondary | ICD-10-CM | POA: Diagnosis not present

## 2023-02-05 DIAGNOSIS — E162 Hypoglycemia, unspecified: Secondary | ICD-10-CM | POA: Diagnosis present

## 2023-02-05 DIAGNOSIS — Z825 Family history of asthma and other chronic lower respiratory diseases: Secondary | ICD-10-CM | POA: Diagnosis not present

## 2023-02-05 DIAGNOSIS — Z79899 Other long term (current) drug therapy: Secondary | ICD-10-CM | POA: Diagnosis not present

## 2023-02-05 DIAGNOSIS — K449 Diaphragmatic hernia without obstruction or gangrene: Secondary | ICD-10-CM | POA: Diagnosis present

## 2023-02-05 DIAGNOSIS — K5909 Other constipation: Secondary | ICD-10-CM | POA: Diagnosis not present

## 2023-02-05 DIAGNOSIS — R6250 Unspecified lack of expected normal physiological development in childhood: Secondary | ICD-10-CM | POA: Diagnosis present

## 2023-02-05 DIAGNOSIS — F419 Anxiety disorder, unspecified: Secondary | ICD-10-CM | POA: Diagnosis present

## 2023-02-05 DIAGNOSIS — K5981 Ogilvie syndrome: Secondary | ICD-10-CM | POA: Diagnosis present

## 2023-02-05 DIAGNOSIS — R4689 Other symptoms and signs involving appearance and behavior: Secondary | ICD-10-CM | POA: Diagnosis present

## 2023-02-05 DIAGNOSIS — R339 Retention of urine, unspecified: Secondary | ICD-10-CM | POA: Diagnosis present

## 2023-02-05 DIAGNOSIS — R1084 Generalized abdominal pain: Secondary | ICD-10-CM

## 2023-02-05 DIAGNOSIS — Z818 Family history of other mental and behavioral disorders: Secondary | ICD-10-CM

## 2023-02-05 DIAGNOSIS — G40909 Epilepsy, unspecified, not intractable, without status epilepticus: Secondary | ICD-10-CM | POA: Diagnosis present

## 2023-02-05 DIAGNOSIS — Z539 Procedure and treatment not carried out, unspecified reason: Secondary | ICD-10-CM | POA: Diagnosis present

## 2023-02-05 DIAGNOSIS — Z8249 Family history of ischemic heart disease and other diseases of the circulatory system: Secondary | ICD-10-CM | POA: Diagnosis not present

## 2023-02-05 DIAGNOSIS — Z83438 Family history of other disorder of lipoprotein metabolism and other lipidemia: Secondary | ICD-10-CM | POA: Diagnosis not present

## 2023-02-05 DIAGNOSIS — K219 Gastro-esophageal reflux disease without esophagitis: Secondary | ICD-10-CM | POA: Diagnosis present

## 2023-02-05 DIAGNOSIS — Q438 Other specified congenital malformations of intestine: Secondary | ICD-10-CM | POA: Diagnosis not present

## 2023-02-05 DIAGNOSIS — Z833 Family history of diabetes mellitus: Secondary | ICD-10-CM | POA: Diagnosis not present

## 2023-02-05 DIAGNOSIS — K56609 Unspecified intestinal obstruction, unspecified as to partial versus complete obstruction: Secondary | ICD-10-CM | POA: Diagnosis not present

## 2023-02-05 DIAGNOSIS — Z803 Family history of malignant neoplasm of breast: Secondary | ICD-10-CM | POA: Diagnosis not present

## 2023-02-05 DIAGNOSIS — K5900 Constipation, unspecified: Secondary | ICD-10-CM | POA: Diagnosis present

## 2023-02-05 LAB — COMPREHENSIVE METABOLIC PANEL
ALT: 21 U/L (ref 0–44)
AST: 33 U/L (ref 15–41)
Albumin: 4.7 g/dL (ref 3.5–5.0)
Alkaline Phosphatase: 79 U/L (ref 38–126)
Anion gap: 18 — ABNORMAL HIGH (ref 5–15)
BUN: 9 mg/dL (ref 6–20)
CO2: 19 mmol/L — ABNORMAL LOW (ref 22–32)
Calcium: 9.7 mg/dL (ref 8.9–10.3)
Chloride: 100 mmol/L (ref 98–111)
Creatinine, Ser: 1.17 mg/dL (ref 0.61–1.24)
GFR, Estimated: >60 mL/min (ref >60)
Glucose, Bld: 82 mg/dL (ref 70–99)
Potassium: 4.3 mmol/L (ref 3.5–5.1)
Sodium: 137 mmol/L (ref 135–145)
Total Bilirubin: 2.3 mg/dL — ABNORMAL HIGH (ref 0.3–1.2)
Total Protein: 7.9 g/dL (ref 6.5–8.1)

## 2023-02-05 LAB — CBC WITH DIFFERENTIAL/PLATELET
Abs Immature Granulocytes: 0.01 K/uL (ref 0.00–0.07)
Basophils Absolute: 0 K/uL (ref 0.0–0.1)
Basophils Relative: 0 %
Eosinophils Absolute: 0 K/uL (ref 0.0–0.5)
Eosinophils Relative: 0 %
HCT: 45.2 % (ref 39.0–52.0)
Hemoglobin: 16.3 g/dL (ref 13.0–17.0)
Immature Granulocytes: 0 %
Lymphocytes Relative: 23 %
Lymphs Abs: 1.7 K/uL (ref 0.7–4.0)
MCH: 33.7 pg (ref 26.0–34.0)
MCHC: 36.1 g/dL — ABNORMAL HIGH (ref 30.0–36.0)
MCV: 93.6 fL (ref 80.0–100.0)
Monocytes Absolute: 0.4 K/uL (ref 0.1–1.0)
Monocytes Relative: 6 %
Neutro Abs: 5.2 K/uL (ref 1.7–7.7)
Neutrophils Relative %: 71 %
Platelets: 227 K/uL (ref 150–400)
RBC: 4.83 MIL/uL (ref 4.22–5.81)
RDW: 11.9 % (ref 11.5–15.5)
WBC: 7.3 K/uL (ref 4.0–10.5)
nRBC: 0 % (ref 0.0–0.2)

## 2023-02-05 LAB — LIPID PANEL
Cholesterol: 178 mg/dL (ref 0–200)
HDL: 49 mg/dL (ref >40)
LDL Cholesterol: 116 mg/dL — ABNORMAL HIGH (ref 0–99)
Total CHOL/HDL Ratio: 3.6 RATIO
Triglycerides: 63 mg/dL (ref <150)
VLDL: 13 mg/dL (ref 0–40)

## 2023-02-05 LAB — MAGNESIUM: Magnesium: 2.1 mg/dL (ref 1.7–2.4)

## 2023-02-05 LAB — LIPASE, BLOOD: Lipase: 29 U/L (ref 11–51)

## 2023-02-05 MED ORDER — MIDAZOLAM HCL 2 MG/2ML IJ SOLN
2.0000 mg | Freq: Once | INTRAMUSCULAR | Status: AC
  Administered 2023-02-05: 2 mg via INTRAVENOUS
  Filled 2023-02-05: qty 2

## 2023-02-05 MED ORDER — POLYETHYLENE GLYCOL 3350 17 G PO PACK: 17.0000 g | PACK | Freq: Every day | ORAL | Status: DC | PRN

## 2023-02-05 MED ORDER — DOCUSATE SODIUM 100 MG PO CAPS: 100.0000 mg | ORAL_CAPSULE | Freq: Two times a day (BID) | ORAL | Status: DC | PRN

## 2023-02-05 MED ORDER — ONDANSETRON HCL 4 MG/2ML IJ SOLN
4.0000 mg | Freq: Four times a day (QID) | INTRAMUSCULAR | Status: DC | PRN
  Administered 2023-02-07: 4 mg via INTRAVENOUS
  Filled 2023-02-05: qty 2

## 2023-02-05 MED ORDER — LACTATED RINGERS IV SOLN: INTRAVENOUS | Status: DC

## 2023-02-05 MED ORDER — DIAZEPAM 5 MG PO TABS: 10.0000 mg | ORAL_TABLET | ORAL | Status: DC

## 2023-02-05 MED ORDER — DEXMEDETOMIDINE HCL IN NACL 400 MCG/100ML IV SOLN
INTRAVENOUS | Status: AC
  Administered 2023-02-05: 0.2 ug/kg/h via INTRAVENOUS
  Filled 2023-02-05: qty 100

## 2023-02-05 MED ORDER — HALOPERIDOL LACTATE 5 MG/ML IJ SOLN
5.0000 mg | INTRAMUSCULAR | Status: AC
  Administered 2023-02-05: 5 mg via INTRAMUSCULAR
  Filled 2023-02-05: qty 1

## 2023-02-05 MED ORDER — MIDAZOLAM HCL (PF) 10 MG/2ML IJ SOLN
4.0000 mg | Freq: Once | INTRAMUSCULAR | Status: AC
  Administered 2023-02-05: 4 mg via INTRAMUSCULAR
  Filled 2023-02-05: qty 2

## 2023-02-05 MED ORDER — DEXMEDETOMIDINE HCL IN NACL 400 MCG/100ML IV SOLN
0.0000 ug/kg/h | INTRAVENOUS | Status: DC
  Administered 2023-02-06: 0.2 ug/kg/h via INTRAVENOUS
  Administered 2023-02-06: 0.3 ug/kg/h via INTRAVENOUS
  Administered 2023-02-07: 0.6 ug/kg/h via INTRAVENOUS
  Administered 2023-02-07 (×2): 0.7 ug/kg/h via INTRAVENOUS
  Administered 2023-02-08 – 2023-02-09 (×3): 0.6 ug/kg/h via INTRAVENOUS
  Filled 2023-02-05 (×8): qty 100

## 2023-02-05 MED ORDER — LACTATED RINGERS IV BOLUS
1000.0000 mL | Freq: Once | INTRAVENOUS | Status: AC
  Administered 2023-02-05: 1000 mL via INTRAVENOUS

## 2023-02-05 MED ORDER — DEXMEDETOMIDINE HCL IN NACL 400 MCG/100ML IV SOLN: 0.0000 ug/kg/h | INTRAVENOUS | Status: DC

## 2023-02-05 MED ORDER — BISACODYL 10 MG RE SUPP
10.0000 mg | Freq: Once | RECTAL | Status: AC
  Administered 2023-02-05: 10 mg via RECTAL
  Filled 2023-02-05: qty 1

## 2023-02-05 MED ORDER — LORAZEPAM 2 MG/ML IJ SOLN
2.0000 mg | Freq: Once | INTRAMUSCULAR | Status: AC
  Administered 2023-02-05: 2 mg via INTRAMUSCULAR
  Filled 2023-02-05: qty 1

## 2023-02-05 MED ORDER — DIPHENHYDRAMINE HCL 50 MG/ML IJ SOLN
25.0000 mg | Freq: Once | INTRAMUSCULAR | Status: AC
  Administered 2023-02-05: 25 mg via INTRAMUSCULAR
  Filled 2023-02-05: qty 1

## 2023-02-05 MED ORDER — ENOXAPARIN SODIUM 40 MG/0.4ML IJ SOSY
40.0000 mg | PREFILLED_SYRINGE | INTRAMUSCULAR | Status: DC
  Administered 2023-02-06 – 2023-02-09 (×4): 40 mg via SUBCUTANEOUS
  Filled 2023-02-05 (×4): qty 0.4

## 2023-02-05 NOTE — Subjective & Objective (Signed)
Patient with history of autism decreased p.o. intake constipation abdominal pain He has been taking MiraLAX 4 times a day but only making a little bit of stool he was seen in [facility] CT scan showed large amount of liquid stool in the colon He continued to decline and eating last go bowel movement was 8 days ago no rectal bleeding or black stools noted.  He is not nauseous or vomiting Patient has known history of seizure disorder on lamotrigine In the emergency department patient required Precedex to perform enema
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
## 2023-02-05 NOTE — ED Notes (Signed)
Precedex increased in preparation for enema.
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
## 2023-02-05 NOTE — H&P (Signed)
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 Unfortunately pt has been intolerant of therapies to help remedy his constipation and despite multiple doses of anxiolytic and haldol he was ultimately placed on precedex infusion to help with some level of sedation so that he may tolerate enema per GI recommendations.   Pt presented with his mother have 2 day of decreased PO intake and 8 days of no BM. She endorses he has indicated he has abdominal pain. Over the past few days he has become more agitated than usual as well. All history is obtained from EDP, chart and family as pt is unable to provide any. Pt reportedly on stool softener at home, miralax 4x daily, and occasional docusate/senna. Pt has had multiple hospitalizations for disimpaction and has previously been approached about an ileostomy which has not been pursued. 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   Pertinent  Medical History  Autism Non verbal Seizures Gerd anxiety Chronic constipation  Significant Hospital Events: Including procedures, antibiotic start and stop dates in addition to other pertinent events   Admitted on precedex for enemas  Interim History / Subjective:    Objective   Blood pressure (!) 81/66, pulse 62, temperature (!) 97.1 F (36.2 C), temperature source Axillary, resp. rate 13, height 5\' 7"  (1.702 m), weight 79.4 kg, SpO2 96%.        Intake/Output Summary (Last 24 hours) at 02/05/2023 2317 Last data filed at 02/05/2023 2240 Gross per 24 hour  Intake 40.97 ml  Output --  Net 40.97 ml   Filed Weights   02/05/23 1612  Weight: 79.4 kg    Examination: General: nad, on precedex at this time  relatively calm HENT: ncat eomi, perrla, mmmp Lungs: ctab Cardiovascular: rrr Abdomen: soft, no rebound no guarding, bs diminished Extremities: no c/c/e Neuro: no focal deficits, unable to assess orientation GU: deferred  Resolved Hospital Problem list     Assessment & Plan:  Ogilvie syndrome Chronic constipation Abdominal pain -appreciate GI -cont with bowel regimen and enema's per their recommendations -req sedation for compliance of this therapy  Agitation  Autism, non verbal -req precedex  -titrate to rass 0 to -1 for therapy to continue  Seizure d/o:  -noted -home meds  Best Practice (right click and "Reselect all SmartList Selections" daily)   Diet/type: NPO DVT prophylaxis: LMWH GI prophylaxis: N/A Lines: N/A Foley:  N/A Code Status:  full code Last date of multidisciplinary goals of care discussion [full code]  Labs   CBC: Recent Labs  Lab 02/02/23 1047 02/05/23 1555  WBC 7.9 7.3  NEUTROABS  --  5.2  HGB 16.1 16.3  HCT 45.0 45.2  MCV 92.8 93.6  PLT 202 227    Basic Metabolic Panel: Recent Labs  Lab 02/02/23 1047 02/02/23 1419 02/05/23 1555  NA 137 138 137  K 4.1 4.5 4.3  CL 103 103 100  CO2 17* 23 19*  GLUCOSE 149* 97 82  BUN 9 8 9   CREATININE 1.03 0.93 1.17  CALCIUM 9.9 9.7 9.7  MG  --   --  2.1   GFR: Estimated Creatinine Clearance: 94.9 mL/min (by C-G formula based on SCr of 1.17 mg/dL). Recent Labs  Lab  02/02/23 1047 02/05/23 1555  WBC 7.9 7.3    Liver Function Tests: Recent Labs  Lab 02/02/23 1047 02/05/23 1555  AST 21 33  ALT 18 21  ALKPHOS 70 79  BILITOT 1.7* 2.3*  PROT 7.8 7.9  ALBUMIN 5.2* 4.7   Recent Labs  Lab 02/02/23 1047 02/05/23 1555  LIPASE 14 29   No results for input(s): "AMMONIA" in the last 168 hours.  ABG No results found for: "PHART", "PCO2ART", "PO2ART", "HCO3", "TCO2", "ACIDBASEDEF", "O2SAT"   Coagulation Profile: No results for input(s): "INR", "PROTIME" in the last 168  hours.  Cardiac Enzymes: No results for input(s): "CKTOTAL", "CKMB", "CKMBINDEX", "TROPONINI" in the last 168 hours.  HbA1C: Hgb A1c MFr Bld  Date/Time Value Ref Range Status  10/20/2009 11:48 AM  4.6 - 6.1 % Final   6.1 (NOTE) The ADA recommends the following therapeutic goal for glycemic control related to Hgb A1c measurement: Goal of therapy: <6.5 Hgb A1c  Reference: American Diabetes Association: Clinical Practice Recommendations 2010, Diabetes Care, 2010, 33: (Suppl  1).    CBG: No results for input(s): "GLUCAP" in the last 168 hours.  Review of Systems:   Unobtainable   Past Medical History:  He,  has a past medical history of Anxiety, Autistic disorder, current or active state, Chronic constipation, GERD (gastroesophageal reflux disease), and Seizures (HCC).   Surgical History:   Past Surgical History:  Procedure Laterality Date   DENTAL RESTORATION/EXTRACTION WITH X-RAY Bilateral 05/27/2021   Procedure: Filling 14-0 Full Mouth Debridement. Fluoride Application.;  Surgeon: Joanna Hews, DMD;  Location: [facility] OR;  Service: Dentistry;  Laterality: Bilateral;   FECAL DISIMPACTION  2018   RADIOLOGY WITH ANESTHESIA N/A 05/27/2021   Procedure: CT ABDOMEN PELVIS WITH ANESTHESIA;  Surgeon: Radiologist, Medication, MD;  Location: [facility] OR;  Service: Radiology;  Laterality: N/A;   WISDOM TOOTH EXTRACTION     age 74     Social History:   reports that he has never smoked. He has never used smokeless tobacco. He reports that he does not drink alcohol and does not use drugs.   Family History:  His family history includes Asthma in his mother; Breast cancer in his paternal grandmother; Depression in his mother; Diabetes in his mother; Heart attack in his maternal grandfather and maternal grandmother; Hyperlipidemia in his mother; Hypertension in his mother; Ulcerative colitis in his father.   Allergies Allergies  Allergen Reactions   Ketamine Other (See Comments)    CAUSES SEIZURES     Other     Lactose     Home Medications  Prior to Admission medications   Medication Sig Start Date End Date Taking? Authorizing Provider  Cyanocobalamin (B-12) 5000 MCG SUBL Place 5,000 mcg under the tongue daily.    [provider]  diazepam (VALIUM) 5 MG tablet CRUSH AND GIVE 1 TABLET BY MOUTH UP TO 3 TIMES PER DAY AS NEEDED FOR ANXIETY OR AGITATION Patient not taking: Reported on 01/31/2022 01/10/22   [name], NP  lamoTRIgine 200 MG TBDP DISSOLVE 1 TABLET IN 1 TEASPOON OF WATER AND GIVE ONCE IN THE MORNING AND ONCE AT NIGHT 09/01/22   [name], NP  omeprazole (PRILOSEC) 20 MG capsule TAKE 1 CAPSULE BY MOUTH TWICE A DAY BEFORE A MEAL, CAN OPEN AND PLACE IN FOOD OR DRINK 09/01/22   Keturah Shavers, MD  OVER THE COUNTER MEDICATION Take 1 mg by mouth daily. Triphala    [provider]  polyethylene glycol powder (GAVILAX) 17 GM/SCOOP powder TAKE  17 G BY MOUTH 3 (THREE) TIMES DAILY. 07/03/20   [name], NP  risperiDONE (RISPERDAL) 1 MG/ML oral solution TAKE TWICE A DAY 01/06/21   [name], NP  sertraline (ZOLOFT) 20 MG/ML concentrated solution TAKE 1.5 ML IN THE MORNING 01/06/21   [name], NP  VALTOCO 20 MG DOSE 10 MG/0.1ML LQPK Give 1 spray into each nostril at the onset of seizure Patient not taking: Reported on 01/31/2022 10/12/21   [name], NP     Critical care time: cc

## 2023-02-05 NOTE — ED Triage Notes (Signed)
Pt has not eaten for two days. Pt passed a little stool yesterday morning and the day before, but is still constipated. Per pt's mother pt is having abdominal pain. Pt is autistic and does not really talk. Pt very uncooperative.
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
## 2023-02-05 NOTE — ED Notes (Signed)
Unable to attempt IV and repeat vitals d/t patient cooperation.
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
## 2023-02-05 NOTE — Consult Note (Addendum)
Referring Provider: Dr. Vonita Moss Primary Care Physician:  Antonio Mis, MD Primary Gastroenterologist: Antonio Silva [facility]   Reason for Consultation: Severe constipation  HPI: Antonio Silva is a 27 y.o. male with a history of anxiety, autistic, nonverbal, seizures, GERD and chronic constipation.    He presented to [facility] 02/02/2023 due to having significant constipation and his parents were concerned that he was having abdominal pain. His parents reported patient was taking MiraLAX 4 times daily which resulted in passing small amounts of loose stools.  Labs in the ED showed a WBC count of 7.9.  Hemoglobin 16.1.  Hematocrit 45.  Platelet 202.  Potassium 4.1.  Sodium 137.  Glucose 149.  BUN 9.  Creatinine 1.03.  Total bili 1.7.  Alk phos 70.  AST 21.  ALT 18.  Lipase 14.  CTAP showed a large amount of liquid stool in the colon, more notable to the left colon and rectum suggestive of motility dysfunction without colon wall thickening or obstruction.  A small hiatal hernia was noted.  The ED physician recommended reducing MiraLAX to twice daily with concerns MiraLAX 4 times daily may have contributed to decreased oral intake and abdominal discomfort.  He presented to the ED today as his mother and father were concerned he continued to have significant constipation with abdominal pain.  He does not demonstrate any active signs of abdominal pain but his oral intake has decreased over the past few weeks.  His last "good bowel movement" consisting of a large amount of mushy stool was 8 days ago.  Since then, he has passed small amounts of liquid brown stool.  No BM today.  No rectal bleeding or black stools.  No nausea or vomiting.  Antonio Silva studies have not yet been collected as the patient requires sedation prior to lab draw.  Abdominal x-ray was ordered, not yet completed.  His parents reported Antonio Silva has had GI issues, primarily chronic constipation for the past 20 years.  He is  seen numerous GIs in the past.  Last seen by GI at [facility] 11/2020 and [facility] 03/2021.  He has received numerous laxatives in the past including MiraLAX, Dulcolax, Amitiza, Trulance and Motegrity.  He has difficulty swallowing tablets and pills and any medication must be crushed if not in the liquid form.  It was unclear if Motegrity failed or if he did not take it due to taste or inability to swallow the tablet.  There was prior discussion for possible surgery with ileostomy which was not pursued.  It looks like he did have a Gastrografin enema back in September 2020 for therapeutic purposes. The patient had a very redundant left colon with a large amount of fecal material throughout the colon especially in the right. Contrast made it to the cecum and there was no evidence of obstruction.   CT scan completed 05/28/2021 showed a large colonic stool burden with patulous distention of the colon without enteric obstruction or volvulus similar to prior exams from 2019 and 2016. 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   He underwent a fecal impaction under sedation in 2018.  He has never undergone an EGD or colonoscopy  He has a history of GERD based on symptoms of gagging and hitting his chest.  On Omeprazole 20 mg daily.   Past Medical History:  Diagnosis Date   Anxiety    Autistic disorder, current or active state    Chronic  constipation    GERD (gastroesophageal reflux disease)    Seizures (HCC)     Past Surgical History:  Procedure Laterality Date   DENTAL RESTORATION/EXTRACTION WITH X-RAY Bilateral 05/27/2021   Procedure: Filling 14-0 Full Mouth Debridement. Fluoride Application.;  Surgeon: Antonio Silva, DMD;  Location: [facility] OR;  Service: Dentistry;  Laterality: Bilateral;   FECAL DISIMPACTION  2018   RADIOLOGY WITH ANESTHESIA N/A 05/27/2021   Procedure: CT ABDOMEN PELVIS WITH ANESTHESIA;  Surgeon: Radiologist, Medication,  MD;  Location: [facility] OR;  Service: Radiology;  Laterality: N/A;   WISDOM TOOTH EXTRACTION     age 13    Prior to Admission medications   Medication Sig Start Date End Date Taking? Authorizing Provider  Cyanocobalamin (B-12) 5000 MCG SUBL Place 5,000 mcg under the tongue daily.    [provider]  diazepam (VALIUM) 5 MG tablet CRUSH AND GIVE 1 TABLET BY MOUTH UP TO 3 TIMES PER DAY AS NEEDED FOR ANXIETY OR AGITATION Patient not taking: Reported on 01/31/2022 01/10/22   Antonio Rising, NP  lamoTRIgine 200 MG TBDP DISSOLVE 1 TABLET IN 1 TEASPOON OF WATER AND GIVE ONCE IN THE MORNING AND ONCE AT NIGHT 09/01/22   Antonio Rising, NP  omeprazole (PRILOSEC) 20 MG capsule TAKE 1 CAPSULE BY MOUTH TWICE A DAY BEFORE A MEAL, CAN OPEN AND PLACE IN FOOD OR DRINK 09/01/22   Antonio Shavers, MD  OVER THE COUNTER MEDICATION Take 1 mg by mouth daily. Triphala    [provider]  polyethylene glycol powder (GAVILAX) 17 GM/SCOOP powder TAKE 17 G BY MOUTH 3 (THREE) TIMES DAILY. 07/03/20   Antonio Rising, NP  risperiDONE (RISPERDAL) 1 MG/ML oral solution TAKE TWICE A DAY 01/06/21   Antonio Rising, NP  sertraline (ZOLOFT) 20 MG/ML concentrated solution TAKE 1.5 ML IN THE MORNING 01/06/21   Antonio Rising, NP  VALTOCO 20 MG DOSE 10 MG/0.1ML LQPK Give 1 spray into each nostril at the onset of seizure Patient not taking: Reported on 01/31/2022 10/12/21   Antonio Rising, NP    No current facility-administered medications for this encounter.   Current Outpatient Medications  Medication Sig Dispense Refill   Cyanocobalamin (B-12) 5000 MCG SUBL Place 5,000 mcg under the tongue daily.     diazepam (VALIUM) 5 MG tablet CRUSH AND GIVE 1 TABLET BY MOUTH UP TO 3 TIMES PER DAY AS NEEDED FOR ANXIETY OR AGITATION (Patient not taking: Reported on 01/31/2022) 60 tablet 5   lamoTRIgine 200 MG TBDP DISSOLVE 1 TABLET IN 1 TEASPOON OF WATER AND GIVE ONCE IN THE MORNING AND ONCE AT NIGHT 60 tablet 5    omeprazole (PRILOSEC) 20 MG capsule TAKE 1 CAPSULE BY MOUTH TWICE A DAY BEFORE A MEAL, CAN OPEN AND PLACE IN FOOD OR DRINK 180 capsule 5   OVER THE COUNTER MEDICATION Take 1 mg by mouth daily. Triphala     polyethylene glycol powder (GAVILAX) 17 GM/SCOOP powder TAKE 17 G BY MOUTH 3 (THREE) TIMES DAILY. 1530 g 5   risperiDONE (RISPERDAL) 1 MG/ML oral solution TAKE TWICE A DAY 120 mL 5   sertraline (ZOLOFT) 20 MG/ML concentrated solution TAKE 1.5 ML IN THE MORNING 60 mL 5   VALTOCO 20 MG DOSE 10 MG/0.1ML LQPK Give 1 spray into each nostril at the onset of seizure (Patient not taking: Reported on 01/31/2022) 2 each 5    Allergies as of 02/05/2023 - Review Complete 02/05/2023  Allergen Reaction Noted   Ketamine Other (See Comments) 06/10/2016   Other  01/07/2013    Family History  Problem Relation Age of Onset   Heart attack Maternal Grandmother        Died at 78   Heart attack Maternal Grandfather        Died at 12   Breast cancer Paternal Grandmother        Died at 25   Asthma Mother    Diabetes Mother    Depression Mother    Hypertension Mother    Hyperlipidemia Mother    Ulcerative colitis Father     Social History   Socioeconomic History   Marital status: Single    Spouse name: Not on file   Number of children: Not on file   Years of education: Not on file   Highest education level: Not on file  Occupational History   Not on file  Tobacco Use   Smoking status: Never   Smokeless tobacco: Never  Vaping Use   Vaping status: Never Used  Substance and Sexual Activity   Alcohol use: Never   Drug use: Never   Sexual activity: Never  Other Topics Concern   Not on file  Social History Narrative   Ossiel is 27 yo male.   He has caregiver and he goes to their home.    He lives with both parents and has no siblings.    He enjoys attending his day program and walking   Social Determinants of Health   Financial Resource Strain: Low Risk  (03/08/2022)   [facility]   Overall Financial Resource Strain (CARDIA)    Difficulty of Paying Living Expenses: Not very hard  Food Insecurity: No Food Insecurity (03/08/2022)   Received from [facility], [facility]   Hunger Vital Sign    Worried About Running Out of Food in the Last Year: Never true    Ran Out of Food in the Last Year: Never true  Transportation Needs: No Transportation Needs (03/23/2021)   Received from [facility], [facility]   PRAPARE - Transportation    Lack of Transportation (Medical): No    Lack of Transportation (Non-Medical): No  Physical Activity: Insufficiently Active (03/08/2022)   Received from [facility], [facility]   Exercise Vital Sign    Days of Exercise per Week: 3 days    Minutes of Exercise per Session: 40 min  Stress: Stress Concern Present (03/08/2022)   Received from [facility] - Occupational Stress Questionnaire    Feeling of Stress : Very much  Social Connections: Somewhat Isolated (03/08/2022)   Received from [facility], [facility]   Social Network    How would you rate your social network (family, work, friends)?: Restricted participation with some degree of social isolation  Intimate Partner Violence: Not At Risk (03/08/2022)   Received from [facility], [facility]   HITS    Over the last 12 months how often did your partner physically hurt you?: 1    Over the last 12 months how often did your partner insult you or talk down to you?: Not on file    Over the last 12 months how often did your partner threaten you with physical harm?: Not on file    Over the last 12 months how often did your partner scream or curse at you?: Not on file    Review of Systems: Limited review of systems, patient is nonverbal.  No history of cardiac or pulmonary  disease.  See HPI.  Physical Exam: Vital signs in last 24 hours: Temp:  [99.4 F (37.4 C)] 99.4 F (37.4 C)  (07/21 1223) Pulse Rate:  [129] 129 (07/21 1223) Resp:  [16] 16 (07/21 1223) BP: (129)/(78) 129/78 (07/21 1223) SpO2:  [95 %] 95 % (07/21 1223)   General: 27 year old autistic nonverbal male in no acute distress at this time. Head:  Normocephalic and atraumatic. Eyes:  No scleral icterus. Conjunctiva pink. Ears:  Normal auditory acuity. Nose:  No deformity, discharge or lesions. Mouth:  Dentition intact. No ulcers or lesions.  Neck:  Supple. No lymphadenopathy or thyromegaly.  Lungs: Breath sounds clear throughout. No wheezes, rhonchi or crackles.  Heart: Rate rhythm, no murmurs. Abdomen: Abdomen is not distended.  Soft, no obvious signs of tenderness, no rebound or guarding.  Positive bowel sounds to all 4 quadrants.  No palpable mass.  No bruit. Rectal: Patient will require sedation for rectal exam as confirmed by parents. Musculoskeletal:  Symmetrical without gross deformities.  Pulses:  Normal pulses noted. Extremities:  Without clubbing or edema. Neurologic:  Alert and  oriented x 4. No focal deficits.  Skin:  Intact without significant lesions or rashes. Psych:  Alert and cooperative. Normal mood and affect.  Intake/Output from previous day: No intake/output data recorded. Intake/Output this shift: No intake/output data recorded.  Lab Results: No results for input(s): "WBC", "HGB", "HCT", "PLT" in the last 72 hours. BMET Recent Labs    02/02/23 1419  NA 138  K 4.5  CL 103  CO2 23  GLUCOSE 97  BUN 8  CREATININE 0.93  CALCIUM 9.7   LFT No results for input(s): "PROT", "ALBUMIN", "AST", "ALT", "ALKPHOS", "BILITOT", "BILIDIR", "IBILI" in the last 72 hours. PT/INR No results for input(s): "LABPROT", "INR" in the last 72 hours. Hepatitis Panel No results for input(s): "HEPBSAG", "HCVAB", "HEPAIGM", "HEPBIGM" in the last 72 hours.    Studies/Results: No results found.  IMPRESSION/PLAN:  27 year old autistic nonverbal male presents with chronic constipation,  suspected abdominal pain and decreased oral intake. Evaluated by numerous GIs in the past, last seen at [facility] and [facility] in 2022. 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  Never had a colonoscopy.  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  It is difficult to ascertain if Motegrity, Trulance or Amitiza were ineffective or if the patient did not tolerate due to taste and difficulty swallowing tablets.  Recently seen in the ED 02/02/2023, CTAP showed a large amount of liquid stool in the colon, more notable to the left colon and rectum suggestive of motility dysfunction without colon wall thickening or obstruction.  -Await CBC, BMP and magnesium level -Await abdominal x-ray results -Patient will require sedation if rectal exam pursued -Endoscopic evaluation deferred, await recommendations to Dr. Lavon Paganini  History of GERD on Omeprazole 20 mg p.o. daily.  Decreased oral intake for the past 1 and half weeks.  No nausea or vomiting. -Continue PPI po QD   History of seizures on Lamotrigine       Arnaldo Natal  02/05/2023, 3:33PM    Attending physician's note  I have taken a history, reviewed the chart and examined the patient. I performed a substantive portion of this encounter, including complete performance of at least one of the key components, in conjunction with the APP. I agree with the APP's note, impression and recommendations.    27 year old male with history of autism, nonverbal, seizure  disorder, chronic constipation presented to ER with poor p.o. intake, worsening constipation and abdominal pain  Reviewed imaging, abdominal x-ray from this afternoon, CT abdomen pelvis with contrast on 02/02/2023.  No signs of volvulus, obstruction or neoplastic lesion. Labs overall unremarkable  Recommend alternating Dulcolax suppository and tapwater enema every 1-2 hours until he has a  bowel movement Continue home laxative regimen with MiraLAX 1 capful 3-4 times daily along with soluble fiber [Benefiber or Citrucel 1 tablespoon 3 times daily with meals]  Pursuing colonoscopy or endoscopic evaluation will be very low yield, invasive procedure with risk for procedural /anesthesia related complications.  Do not recommend endoscopic evaluation  Family at bedside were provided an opportunity to ask questions and all were answered. They agreed with the plan and demonstrated an understanding of the instructions.  Iona Beard , MD [phone]

## 2023-02-06 DIAGNOSIS — F840 Autistic disorder: Secondary | ICD-10-CM | POA: Diagnosis not present

## 2023-02-06 DIAGNOSIS — K5900 Constipation, unspecified: Secondary | ICD-10-CM | POA: Diagnosis not present

## 2023-02-06 DIAGNOSIS — R451 Restlessness and agitation: Secondary | ICD-10-CM | POA: Diagnosis not present

## 2023-02-06 DIAGNOSIS — R4689 Other symptoms and signs involving appearance and behavior: Secondary | ICD-10-CM

## 2023-02-06 LAB — MRSA NEXT GEN BY PCR, NASAL: MRSA by PCR Next Gen: NOT DETECTED

## 2023-02-06 LAB — URINALYSIS, ROUTINE W REFLEX MICROSCOPIC
Bilirubin Urine: NEGATIVE
Glucose, UA: NEGATIVE mg/dL
Hgb urine dipstick: NEGATIVE
Ketones, ur: 80 mg/dL — AB
Leukocytes,Ua: NEGATIVE
Nitrite: NEGATIVE
Protein, ur: NEGATIVE mg/dL
Specific Gravity, Urine: 1.013 (ref 1.005–1.030)
pH: 6 (ref 5.0–8.0)

## 2023-02-06 LAB — CBC
HCT: 40 % (ref 39.0–52.0)
Hemoglobin: 14 g/dL (ref 13.0–17.0)
MCH: 32.8 pg (ref 26.0–34.0)
MCHC: 35 g/dL (ref 30.0–36.0)
MCV: 93.7 fL (ref 80.0–100.0)
Platelets: 193 10*3/uL (ref 150–400)
RBC: 4.27 MIL/uL (ref 4.22–5.81)
RDW: 12 % (ref 11.5–15.5)
WBC: 5.5 10*3/uL (ref 4.0–10.5)
nRBC: 0 % (ref 0.0–0.2)

## 2023-02-06 LAB — BASIC METABOLIC PANEL
Anion gap: 11 (ref 5–15)
BUN: 11 mg/dL (ref 6–20)
CO2: 22 mmol/L (ref 22–32)
Calcium: 8.9 mg/dL (ref 8.9–10.3)
Chloride: 103 mmol/L (ref 98–111)
Creatinine, Ser: 0.97 mg/dL (ref 0.61–1.24)
GFR, Estimated: >60 mL/min (ref >60)
Glucose, Bld: 80 mg/dL (ref 70–99)
Potassium: 3.9 mmol/L (ref 3.5–5.1)
Sodium: 136 mmol/L (ref 135–145)

## 2023-02-06 LAB — GLUCOSE, CAPILLARY
Glucose-Capillary: 81 mg/dL (ref 70–99)
Glucose-Capillary: 71 mg/dL (ref 70–99)
Glucose-Capillary: 66 mg/dL — ABNORMAL LOW (ref 70–99)
Glucose-Capillary: 138 mg/dL — ABNORMAL HIGH (ref 70–99)
Glucose-Capillary: 88 mg/dL (ref 70–99)

## 2023-02-06 LAB — HIV ANTIBODY (ROUTINE TESTING W REFLEX): HIV Screen 4th Generation wRfx: NONREACTIVE

## 2023-02-06 LAB — MAGNESIUM: Magnesium: 1.9 mg/dL (ref 1.7–2.4)

## 2023-02-06 MED ORDER — MIDAZOLAM HCL 2 MG/2ML IJ SOLN: 2.0000 mg | INTRAMUSCULAR | Status: DC | PRN

## 2023-02-06 MED ORDER — MIDAZOLAM HCL 2 MG/2ML IJ SOLN
2.0000 mg | INTRAMUSCULAR | Status: AC | PRN
  Administered 2023-02-06 (×2): 2 mg via INTRAVENOUS
  Filled 2023-02-06 (×2): qty 2

## 2023-02-06 MED ORDER — LAMOTRIGINE 100 MG PO TABS: 200.0000 mg | ORAL_TABLET | Freq: Two times a day (BID) | ORAL | Status: DC

## 2023-02-06 MED ORDER — DEXTROSE 50 % IV SOLN: 12.5000 g | INTRAVENOUS | Status: AC

## 2023-02-06 MED ORDER — HALOPERIDOL LACTATE 5 MG/ML IJ SOLN
INTRAMUSCULAR | Status: AC
  Administered 2023-02-06: 5 mg
  Filled 2023-02-06: qty 1

## 2023-02-06 MED ORDER — MIDAZOLAM HCL 2 MG/2ML IJ SOLN
1.0000 mg | INTRAMUSCULAR | Status: DC | PRN
  Administered 2023-02-06: 1 mg via INTRAVENOUS
  Administered 2023-02-06 (×2): 2 mg via INTRAVENOUS
  Administered 2023-02-06 – 2023-02-07 (×2): 1 mg via INTRAVENOUS
  Administered 2023-02-07 (×2): 2 mg via INTRAVENOUS
  Filled 2023-02-06 (×7): qty 2

## 2023-02-06 MED ORDER — MIDAZOLAM HCL 2 MG/2ML IJ SOLN
INTRAMUSCULAR | Status: AC
  Administered 2023-02-06: 2 mg
  Filled 2023-02-06: qty 2

## 2023-02-06 MED ORDER — HALOPERIDOL LACTATE 5 MG/ML IJ SOLN: 5.0000 mg | Freq: Four times a day (QID) | INTRAMUSCULAR | Status: DC | PRN

## 2023-02-06 MED ORDER — FENTANYL CITRATE PF 50 MCG/ML IJ SOSY
PREFILLED_SYRINGE | INTRAMUSCULAR | Status: AC
  Administered 2023-02-06: 50 ug
  Filled 2023-02-06: qty 1

## 2023-02-06 MED ORDER — SODIUM CHLORIDE 0.9 % IV SOLN
3000.0000 mg | Freq: Once | INTRAVENOUS | Status: AC
  Administered 2023-02-06: 3000 mg via INTRAVENOUS
  Filled 2023-02-06: qty 30

## 2023-02-06 MED ORDER — CHLORHEXIDINE GLUCONATE CLOTH 2 % EX PADS
6.0000 | MEDICATED_PAD | Freq: Every day | CUTANEOUS | Status: DC
  Administered 2023-02-06 – 2023-02-08 (×3): 6 via TOPICAL

## 2023-02-06 MED ORDER — FENTANYL CITRATE PF 50 MCG/ML IJ SOSY
50.0000 ug | PREFILLED_SYRINGE | INTRAMUSCULAR | Status: DC | PRN
  Administered 2023-02-07: 50 ug via INTRAVENOUS
  Filled 2023-02-06: qty 2
  Filled 2023-02-06: qty 1

## 2023-02-06 MED ORDER — BISACODYL 10 MG RE SUPP
10.0000 mg | RECTAL | Status: DC
  Administered 2023-02-06 – 2023-02-07 (×6): 10 mg via RECTAL
  Filled 2023-02-06 (×6): qty 1

## 2023-02-06 MED ORDER — DIPHENHYDRAMINE HCL 50 MG/ML IJ SOLN
50.0000 mg | Freq: Once | INTRAMUSCULAR | Status: AC
  Administered 2023-02-06: 50 mg via INTRAVENOUS
  Filled 2023-02-06: qty 1

## 2023-02-06 MED ORDER — HALOPERIDOL LACTATE 5 MG/ML IJ SOLN
INTRAMUSCULAR | Status: AC
  Filled 2023-02-06: qty 1

## 2023-02-06 MED ORDER — LEVETIRACETAM IN NACL 1000 MG/100ML IV SOLN
1000.0000 mg | Freq: Two times a day (BID) | INTRAVENOUS | Status: DC
  Administered 2023-02-07: 1000 mg via INTRAVENOUS
  Filled 2023-02-06: qty 100

## 2023-02-06 MED ORDER — DEXTROSE 50 % IV SOLN
12.5000 g | INTRAVENOUS | Status: AC
  Administered 2023-02-06: 12.5 g via INTRAVENOUS
  Filled 2023-02-06: qty 50

## 2023-02-06 NOTE — TOC CM/SW Note (Signed)
Transition of Care [facility]) - Inpatient Brief Assessment   Patient Details  Name: DALESSANDRO BALDYGA MRN: [id number] Date of Birth: 1995-08-07  Transition of Care [facility]) CM/SW Contact:    Tom-Johnson, Hershal Coria, RN Phone Number: 02/06/2023, 12:54 PM   Clinical Narrative:  Patient presented to the ED with abdominal pain and difficulty passing stool. Noted to be agitated, on Precedex and restraint.  Mother, Lupita Leash and caregiver, Erskine Squibb at bedside.  Patient has hx of Severe Constipation with multiple Hospitalizations for disimpaction. GI consulted.    CM will continue to follow for needs as patient progresses with care towards discharge.   Transition of Care Asessment: Insurance and Status: Insurance coverage has been reviewed Patient has primary care physician: Yes Home environment has been reviewed: Yes Prior level of function:: Severe Autism with caregiver Prior/Current Home Services: Current home services Social Determinants of Health Reivew: SDOH reviewed no interventions necessary Readmission risk has been reviewed: Yes Transition of care needs: transition of care needs identified, TOC will continue to follow

## 2023-02-06 NOTE — Progress Notes (Signed)
NAME:  Antonio Silva, MRN:  [id number], DOB:  [birth year], LOS: 1 ADMISSION DATE:  02/05/2023, CONSULTATION DATE:  02/05/23 REFERRING MD:  EDP, CHIEF COMPLAINT:  agitation req sedation   History of Present Illness:  27 yo male with severe autism presented with constipation and h/o Ogilvie syndrome. Unfortunately pt has been intolerant of therapies to help remedy his constipation and despite multiple doses of anxiolytic and haldol he was ultimately placed on precedex infusion to help with some level of sedation so that he may tolerate enema per GI recommendations.   Pt presented with his mother have 2 day of decreased PO intake and 8 days of no BM. She endorses he has indicated he has abdominal pain. Over the past few days he has become more agitated than usual as well. All history is obtained from EDP, chart and family as pt is unable to provide any. Pt reportedly on stool softener at home, miralax 4x daily, and occasional docusate/senna. Pt has had multiple hospitalizations for disimpaction and has previously been approached about an ileostomy which has not been pursued. 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   Pertinent  Medical History  Autism Non verbal Seizures Gerd anxiety Chronic constipation  Significant Hospital Events: Including procedures, antibiotic start and stop dates in addition to other pertinent events   Admitted on precedex for enemas  Interim History / Subjective:  Remains on precedex 0.3 No stool output overnight   Objective   Blood pressure 94/60, pulse (!) 42, temperature 97.9 F (36.6 C), temperature source Axillary, resp. rate 12, height 5\' 7"  (1.702 m), weight 76.4 kg, SpO2 100%.        Intake/Output Summary (Last 24 hours) at 02/06/2023 0855 Last data filed at 02/06/2023 0700 Gross per 24 hour  Intake 1744.64 ml  Output --  Net 1744.64 ml   Filed Weights   02/05/23 1612 02/06/23 0500  Weight:  79.4 kg 76.4 kg    Examination: Dex 0.3 General:  Adult male sedated in bed in NAD HEENT: MM pink/moist, pupils 3/r Neuro:  sedated, awakens to verbal/ light touch, opens eyes, spoke non comprehensible word and drifts back off asleep, MAE,  CV: rr, SB> up to SR with stimulation, no murmur PULM:  non labored, CTA GI: soft, bs+, no obvious tenderness/ distention  Extremities: warm/dry, no LE edema  Skin: no rashes  - no UOP reported overnight  - labs reviewed> unremarkable CBC/ BMET Afebrile   Resolved Hospital Problem list     Assessment & Plan:  Ogilvie syndrome Chronic constipation Abdominal pain P:  - appreciate [facility] recs - dulcolax q 4hrs with tap water enemas 2hrs after suppositories.  With colace and miralax if able to take PO while on precedex  - cont LR 100 ml/hr for now   Agitation  Autism, severe, non verbal - required precedex after failed haldol, ativan, and versed P:  - cont precedex as needed, RASS goal 0/-1 with prn versed - bladder scan prn - holding home zoloft, risperidone, zyprexa till able to take PO's    Seizure d/o:  - seizure precautions  - restart lamotrigine when able to take PO's   Best Practice (right click and "Reselect all SmartList Selections" daily)   Diet/type: NPO DVT prophylaxis: LMWH GI prophylaxis: N/A Lines: N/A Foley:  N/A Code Status:  full code Last date of multidisciplinary goals of care discussion [full code]  Father updated at bedside 7/22 am.   Labs  CBC: Recent Labs  Lab 02/02/23 1047 02/05/23 1555 02/06/23 0403  WBC 7.9 7.3 5.5  NEUTROABS  --  5.2  --   HGB 16.1 16.3 14.0  HCT 45.0 45.2 40.0  MCV 92.8 93.6 93.7  PLT 202 227 193    Basic Metabolic Panel: Recent Labs  Lab 02/02/23 1047 02/02/23 1419 02/05/23 1555 02/06/23 0403  NA 137 138 137 136  K 4.1 4.5 4.3 3.9  CL 103 103 100 103  CO2 17* 23 19* 22  GLUCOSE 149* 97 82 80  BUN 9 8 9 11   CREATININE 1.03 0.93 1.17 0.97  CALCIUM  9.9 9.7 9.7 8.9  MG  --   --  2.1 1.9   GFR: Estimated Creatinine Clearance: 106 mL/min (by C-G formula based on SCr of 0.97 mg/dL). Recent Labs  Lab 02/02/23 1047 02/05/23 1555 02/06/23 0403  WBC 7.9 7.3 5.5    Liver Function Tests: Recent Labs  Lab 02/02/23 1047 02/05/23 1555  AST 21 33  ALT 18 21  ALKPHOS 70 79  BILITOT 1.7* 2.3*  PROT 7.8 7.9  ALBUMIN 5.2* 4.7   Recent Labs  Lab 02/02/23 1047 02/05/23 1555  LIPASE 14 29   No results for input(s): "AMMONIA" in the last 168 hours.  ABG No results found for: "PHART", "PCO2ART", "PO2ART", "HCO3", "TCO2", "ACIDBASEDEF", "O2SAT"   Coagulation Profile: No results for input(s): "INR", "PROTIME" in the last 168 hours.  Cardiac Enzymes: No results for input(s): "CKTOTAL", "CKMB", "CKMBINDEX", "TROPONINI" in the last 168 hours.  HbA1C: Hgb A1c MFr Bld  Date/Time Value Ref Range Status  10/20/2009 11:48 AM  4.6 - 6.1 % Final   6.1 (NOTE) The ADA recommends the following therapeutic goal for glycemic control related to Hgb A1c measurement: Goal of therapy: <6.5 Hgb A1c  Reference: American Diabetes Association: Clinical Practice Recommendations 2010, Diabetes Care, 2010, 33: (Suppl  1).    CBG: Recent Labs  Lab 02/06/23 0526  GLUCAP 81   Critical care time: 32 min        Posey Boyer, MSN, NP, AG-ACNP-BC [facility] 02/06/2023, 8:55 AM  See Amion for pager If no response to pager , please call [phone] until 7pm After 7:00 pm call Elink  [phone]

## 2023-02-06 NOTE — Progress Notes (Addendum)
eLink Physician-Brief Progress Note Patient Name: Antonio Silva DOB: 29-Jul-1995 MRN: [id number]   Date of Service  02/06/2023  HPI/Events of Note  27 year old autistic male that initially presented with severe constipation and Ogilvie syndrome.  He has been unable to place an NG tube due to severe agitation.  Unable to take his home Lamictal.  eICU Interventions  Primary team can discuss with neurology about long-term antiepileptics.  For now, load with Keppra and maintain IV Keppra.  Add every 4 hours CBGs given no nutrition.  Hypoglycemia protocol if necessary.   2305- Needed moderate sedation (Dex+ 6mg  versed + 50 fent + 50 IV benadryl)  and still didn't tolerate cortrak. Very agitated with precedex and versed. Will request ground team eval for moderate sedation.  Intervention Category Major Interventions: Seizures - evaluation and management  Amyiah Gaba 02/06/2023, 9:12 PM

## 2023-02-06 NOTE — Progress Notes (Addendum)
Informed of urinary retention, bladder scan > .  No UOP overnight  P:  - insert foley, retention - prn versed as needed w/ precedex    Mother now at bedside with caregiver.  Does not think he will cooperate with any PO meds.  Will consult neurology for AED recommendations as unable to give lamictal.  Last dose 7/20.  Last seizure several years ago, pt of [name], NP.    Addendum: mother is ok with proceeding with a cortrak for now.  Will hold off on neurology consult pending cortrak placement and resume home meds via tube.      Posey Boyer, MSN, NP, AG-ACNP-BC [facility] 02/06/2023, 10:42 AM  See Amion for pager If no response to pager , please call [phone] until 7pm After 7:00 pm call Elink  [phone]

## 2023-02-06 NOTE — Progress Notes (Signed)
eLink Physician-Brief Progress Note Patient Name: Antonio Silva DOB: 20-Apr-1996 MRN: [id number]   Date of Service  02/06/2023  HPI/Events of Note  27 year old autistic male that presented with severe constipation and Ogilvie syndrome.  He was severely constipated and agitated as a result Precedex as a result.  eICU Interventions  GI consultation is ordered with aggressive bowel regimen  Precedex for agitation, add Versed as needed for enemas and periods of extreme agitation.  Home meds continued for seizure disorder  Enoxaparin for DVT prophylaxis GI prophylaxis not indicated     Intervention Category Evaluation Type: New Patient Evaluation  [name] 02/06/2023, 6:39 AM

## 2023-02-06 NOTE — Progress Notes (Signed)
PCCM Update:  Attempted to place coretrak under sedation.  Patient given 6mg  of versed, 5mg  IV haldol, 50mg  IV benedryl and IV fentanyl with some sedating effect but he would wake up and be very aggressive hindering the cortrak from being placed.   Procedure was aborted.  Melody Comas, MD [facility] Office: [phone]   See Amion for personal pager PCCM on call pager [phone] until 7pm. Please call Elink 7p-7a. [phone]

## 2023-02-06 NOTE — Progress Notes (Signed)
[facility] ROUNDING NOTE   Subjective: No stool output overnight, but it does not appear patient received enema.   Objective: Vital signs in last 24 hours: Temp:  [97.1 F (36.2 C)-98 F (36.7 C)] 98 F (36.7 C) (07/22 1124) Pulse Rate:  [41-85] 42 (07/22 0730) Resp:  [11-18] 12 (07/22 0730) BP: (81-119)/(59-87) 94/60 (07/22 0730) SpO2:  [93 %-100 %] 100 % (07/22 0730) Weight:  [76.4 kg] 76.4 kg (07/22 0500) Last BM Date :  (PTA) General: NAD, nonverbal Caucasian male, father at bedside Lungs:  CTA b/l, no w/r/r Heart:  RRR, no m/r/g Abdomen:  Soft, slightly distended, ND, hypoactive bowel sounds Ext:  No c/c/e    Intake/Output from previous day: 07/21 0701 - 07/22 0700 In: 1744.6 [I.V.:744.6; IV Piggyback:1000] Out: -  Intake/Output this shift: No intake/output data recorded.   Lab Results: Recent Labs    02/05/23 1555 02/06/23 0403  WBC 7.3 5.5  HGB 16.3 14.0  PLT 227 193  MCV 93.6 93.7   BMET Recent Labs    02/05/23 1555 02/06/23 0403  NA 137 136  K 4.3 3.9  CL 100 103  CO2 19* 22  GLUCOSE 82 80  BUN 9 11  CREATININE 1.17 0.97  CALCIUM 9.7 8.9   LFT Recent Labs    02/05/23 1555  PROT 7.9  ALBUMIN 4.7  AST 33  ALT 21  ALKPHOS 79  BILITOT 2.3*   PT/INR No results for input(s): "INR" in the last 72 hours.    Imaging/Other results: DG Abdomen 1 View  Result Date: 02/05/2023 CLINICAL DATA:  Abdominal pain and constipation. EXAM: ABDOMEN - 1 VIEW COMPARISON:  CT abdomen and pelvis 02/02/2023 FINDINGS: The far right lateral abdomen and lower abdomen/pelvis were excluded from imaging. There is mild gaseous distension of the transverse colon. Scattered gas is present elsewhere and large and small bowel. There may be a few small air-fluid levels in the left upper quadrant. No dilated loops of bowel are seen to suggest obstruction. No abnormal abdominal calcification is seen. The included lung bases are clear. No acute osseous  abnormality is seen. IMPRESSION: Nonobstructed bowel-gas pattern. Electronically Signed   By: Sebastian Ache M.D.   On: 02/05/2023 16:30      Assessment and Plan:  27 year old severely autistic nonverbal male with chronic constipation, admitted with obstipation/severe stool burden in left colon.  Recommendations yesterday were to alternate Dulcolax suppository and tapwater enema every 1-2 hours until bowel movement and to continue home laxative regimen of MiraLAX 3-4 times daily.  Due to severe agitation and intolerance of rectal exam/rectal therapies, patient requires sedation No new recommendations today.  Please proceed with aggressive enema regimen to relieve left-sided stool burden.  Endoscopic treatments for obstipation, with risks that are likely not necessary at this time.  Obstipation -Alternate tapwater enema with Dulcolax suppository every 2 hours until stool produced - Continue MiraLAX 1 capful 3-4 times daily -If feeding tube placed, then can consider more aggressive oral regimen such as a bowel prep   Jenel Lucks, MD  02/06/2023, 4:19 PM [facility]

## 2023-02-06 NOTE — Progress Notes (Signed)
Cortrak Tube Team Note:  Consult received to place a Cortrak feeding tube.   Patient became agitated with initial placement attempt so received sedation for further attempts. However, despite heavy sedation patient would wake up and become very aggressive. After many unsuccessful attempts with assist from MD's and RN Cortrak placement was aborted.   Per discussion with Dr. Francine Graven, can discontinue cortrak order at this time. Please re-consult if cortrak placement needed in the future.   Shelle Iron RD, LDN For contact information, refer to [facility].

## 2023-02-07 ENCOUNTER — Inpatient Hospital Stay (HOSPITAL_COMMUNITY): Payer: 59

## 2023-02-07 DIAGNOSIS — F840 Autistic disorder: Secondary | ICD-10-CM | POA: Diagnosis not present

## 2023-02-07 DIAGNOSIS — R451 Restlessness and agitation: Secondary | ICD-10-CM | POA: Diagnosis not present

## 2023-02-07 DIAGNOSIS — K5900 Constipation, unspecified: Secondary | ICD-10-CM | POA: Diagnosis not present

## 2023-02-07 LAB — BASIC METABOLIC PANEL
Anion gap: 8 (ref 5–15)
BUN: 8 mg/dL (ref 6–20)
CO2: 21 mmol/L — ABNORMAL LOW (ref 22–32)
Calcium: 8.5 mg/dL — ABNORMAL LOW (ref 8.9–10.3)
Chloride: 108 mmol/L (ref 98–111)
Creatinine, Ser: 1.01 mg/dL (ref 0.61–1.24)
GFR, Estimated: >60 mL/min (ref >60)
Glucose, Bld: 92 mg/dL (ref 70–99)
Potassium: 4.2 mmol/L (ref 3.5–5.1)
Sodium: 137 mmol/L (ref 135–145)

## 2023-02-07 LAB — GLUCOSE, CAPILLARY
Glucose-Capillary: 107 mg/dL — ABNORMAL HIGH (ref 70–99)
Glucose-Capillary: 122 mg/dL — ABNORMAL HIGH (ref 70–99)
Glucose-Capillary: 129 mg/dL — ABNORMAL HIGH (ref 70–99)
Glucose-Capillary: 134 mg/dL — ABNORMAL HIGH (ref 70–99)
Glucose-Capillary: 62 mg/dL — ABNORMAL LOW (ref 70–99)
Glucose-Capillary: 91 mg/dL (ref 70–99)
Glucose-Capillary: 95 mg/dL (ref 70–99)

## 2023-02-07 MED ORDER — DEXTROSE IN LACTATED RINGERS 5 % IV SOLN: INTRAVENOUS | Status: DC

## 2023-02-07 MED ORDER — PANTOPRAZOLE SODIUM 40 MG IV SOLR
40.0000 mg | INTRAVENOUS | Status: DC
  Administered 2023-02-07 – 2023-02-09 (×3): 40 mg via INTRAVENOUS
  Filled 2023-02-07 (×3): qty 10

## 2023-02-07 MED ORDER — DEXTROSE 50 % IV SOLN
12.5000 g | INTRAVENOUS | Status: AC
  Administered 2023-02-07: 12.5 g via INTRAVENOUS

## 2023-02-07 MED ORDER — VALPROATE SODIUM 100 MG/ML IV SOLN
1500.0000 mg | Freq: Once | INTRAVENOUS | Status: AC
  Administered 2023-02-07: 1500 mg via INTRAVENOUS
  Filled 2023-02-07: qty 15

## 2023-02-07 MED ORDER — DEXTROSE 50 % IV SOLN
INTRAVENOUS | Status: AC
  Filled 2023-02-07: qty 50

## 2023-02-07 MED ORDER — VALPROATE SODIUM 100 MG/ML IV SOLN
500.0000 mg | Freq: Two times a day (BID) | INTRAVENOUS | Status: DC
  Administered 2023-02-07 – 2023-02-09 (×4): 500 mg via INTRAVENOUS
  Filled 2023-02-07 (×6): qty 5

## 2023-02-07 NOTE — Plan of Care (Signed)
Called by Posey Boyer, PCCM. 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 access.  History of seizures and severe autism.  Unable to swallow home lamotrigine 200 twice daily. Wanted to know alternatives of AEDs. Okay to hold lamotrigine and restart on regular home dose if the medicine has not been discontinued for more than 4 or 5 days. If the lamotrigine is not able to be given or p.o. access is not achieved and that has been more than 5 days missing, at that point, Lamictal will need to be started on lower dose and uptitrated. In the interim, because of his behavioral issues, Keppra is not a great AED. I would recommend loading him with Depakote 20 mg/kg IV today and then starting him on Depakote 500 twice daily until p.o. access is obtained for lamotrigine. Please feel free to call with questions as needed.    -- Milon Dikes, MD Neurologist [facility] Pager: [phone]

## 2023-02-07 NOTE — Progress Notes (Signed)
[facility] ROUNDING NOTE   Subjective: Treatments have been limited by severe agitation despite sedation.  Attempts at [facility] placement unsuccessful yesterday due to inability to achieve adequate sedation.  He has received some dulcolax suppositories and some enemas and has produced several soft to loose brown stools, moderate in size.  Digital rectal exam today revealed no solid stool in rectal vault. KUB today showed dilated loops of colon, more so in the left colon   Objective: Vital signs in last 24 hours: Temp:  [97.3 F (36.3 C)-98 F (36.7 C)] 97.3 F (36.3 C) (07/23 0900) Pulse Rate:  [41-114] 46 (07/23 1100) Resp:  [9-20] 12 (07/23 1100) BP: (87-138)/(62-105) 118/89 (07/23 1100) SpO2:  [77 %-100 %] 95 % (07/23 1100) Weight:  [75.7 kg] 75.7 kg (07/23 0500) Last BM Date :  (PTA) General: Autistic, nonverbal Caucasian male, sedated with precedex, but interactive Lungs:  CTA b/l, no w/r/r Heart:  RRR, no m/r/g Abdomen:  Soft, NT, slight distention with hypoactive bowel sounds, +BS Rectum:  No stool in rectal vault.  Normal resting tone, no mass Ext:  No c/c/e    Intake/Output from previous day: 07/22 0701 - 07/23 0700 In: 2776.5 [I.V.:2496.6; IV Piggyback:279.9] Out: 3050 [Urine:3050] Intake/Output this shift: Total I/O In: 848 [I.V.:683; IV Piggyback:165] Out: 950 [Urine:950]   Lab Results: Recent Labs    02/05/23 1555 02/06/23 0403  WBC 7.3 5.5  HGB 16.3 14.0  PLT 227 193  MCV 93.6 93.7   BMET Recent Labs    02/05/23 1555 02/06/23 0403 02/07/23 0053  NA 137 136 137  K 4.3 3.9 4.2  CL 100 103 108  CO2 19* 22 21*  GLUCOSE 82 80 92  BUN 9 11 8   CREATININE 1.17 0.97 1.01  CALCIUM 9.7 8.9 8.5*   LFT Recent Labs    02/05/23 1555  PROT 7.9  ALBUMIN 4.7  AST 33  ALT 21  ALKPHOS 79  BILITOT 2.3*   PT/INR No results for input(s): "INR" in the last 72 hours.    Imaging/Other results: DG Abdomen 1 View  Result Date:  02/05/2023 CLINICAL DATA:  Abdominal pain and constipation. EXAM: ABDOMEN - 1 VIEW COMPARISON:  CT abdomen and pelvis 02/02/2023 FINDINGS: The far right lateral abdomen and lower abdomen/pelvis were excluded from imaging. There is mild gaseous distension of the transverse colon. Scattered gas is present elsewhere and large and small bowel. There may be a few small air-fluid levels in the left upper quadrant. No dilated loops of bowel are seen to suggest obstruction. No abnormal abdominal calcification is seen. The included lung bases are clear. No acute osseous abnormality is seen. IMPRESSION: Nonobstructed bowel-gas pattern. Electronically Signed   By: Sebastian Ache M.D.   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  Treatment with typical laxative therapy by patient's inability to swallow pills and severe agitation with any rectal therapies.  We have achieved some bowel movements with rectal therapies while the patient is sedated, but the patient likely has significant more stool to evacuate per the patient's mother, given the absence of any bowel movements for 8 days.  Low suspicion that he has obstipation from solid stool given his rectal exam. His abdomen was not suggestive of obstruction (soft, minimally distended).  His colon appears moderately dilated on imaging, and I suspect he has some degree of pseudoobstruction.  We  discussed potential options for further treatment of his constipation to include sedation with proprofol to facilitate placement of an NG tube with plans for bowel prep vs treatment with neostigmine for pseudo-obstruction. After discussion with PCCM team,  I think neostigmine offers the quickest and possible more effective treatment for his condition.  As his heart rate has been in the 40s while sedated, will plan to lighten sedation to reassess  resting heart rate to reduce risk of problematic bradycardia with neostigmine.  Constipation/colonic pseudo-obstruction - Neostigmine once more accurate resting HR established with no/lighter sedation - If neostigmine not effective at producing significant stool burden, then would consider sedation with propofol and placement of NG tube for bowel prep. - Ok to stop rectal therapies given absence of rectal stool and significant distress to the patient caused by these therapies   Jenel Lucks, MD  02/07/2023, 12:51 PM [facility]

## 2023-02-07 NOTE — Progress Notes (Signed)
NAME:  Antonio Silva, MRN:  [id number], DOB:  06-Mar-1996, LOS: 2 ADMISSION DATE:  02/05/2023, CONSULTATION DATE:  02/05/23 REFERRING MD:  EDP, CHIEF COMPLAINT:  agitation req sedation   History of Present Illness:  27 yo male with severe autism presented with constipation and h/o Ogilvie syndrome. Unfortunately pt has been intolerant of therapies to help remedy his constipation and despite multiple doses of anxiolytic and haldol he was ultimately placed on precedex infusion to help with some level of sedation so that he may tolerate enema per GI recommendations.   Pt presented with his mother have 2 day of decreased PO intake and 8 days of no BM. She endorses he has indicated he has abdominal pain. Over the past few days he has become more agitated than usual as well. All history is obtained from EDP, chart and family as pt is unable to provide any. Pt reportedly on stool softener at home, miralax 4x daily, and occasional docusate/senna. Pt has had multiple hospitalizations for disimpaction and has previously been approached about an ileostomy which has not been pursued. 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   Pertinent  Medical History  Autism Non verbal Seizures Gerd anxiety Chronic constipation  Significant Hospital Events: Including procedures, antibiotic start and stop dates in addition to other pertinent events   Admitted on precedex for enemas 7/22 ongoing precedex and multiple prns for suppositories/ enemas, cortrak attempted x2 (w/sedation) unsuccessful.  Foley placed for urinary retention > 1L  Interim History / Subjective:  Remains on precedex 0.6 and still needing intermittent PRNs One large BM and one small overnight.  One large BM- pad is soaked but also has thick/ pasty brown stool Hypoglycemic this am   Objective   Blood pressure 136/87, pulse 77, temperature (!) 97.3 F (36.3 C), temperature source Axillary,  resp. rate 11, height 5\' 7"  (1.702 m), weight 75.7 kg, SpO2 99%.        Intake/Output Summary (Last 24 hours) at 02/07/2023 0933 Last data filed at 02/07/2023 0920 Gross per 24 hour  Intake 2559.84 ml  Output 3750 ml  Net -1190.16 ml   Filed Weights   02/05/23 1612 02/06/23 0500 02/07/23 0500  Weight: 79.4 kg 76.4 kg 75.7 kg   Examination: Dex 0.7 General:  Young adult male lying in bed, NAD, mildly restless HEENT: MM pink/moist Neuro: Awake, makes eye contact, verbalizes "home" possibly, mostly incomprehensible, MAE- attempts to grab still  CV: rr, NSR, SB at times, no murmur PULM:  non labored, CTA, room air GI: soft, bs hypo, no guarding or obvious tenderness, foley- cyu Extremities: warm/dry, no LE edema  Skin: no rashes   Afebrile  BMET reviewed UOP 3L/ 24hrs  Net +1.4L  Resolved Hospital Problem list     Assessment & Plan:  Concern for Ogilvie syndrome Chronic constipation Abdominal pain with poor PO intake P:  - appreciate [place] [facility] recommendations, pending further recommendations today  - 1 large BM and small BM overnight, f/b one large liquid/ brown pasty BM this am with miralax if awake enough to safely swallow orals.   - repeat KUB - cont dulcolax alternating with tap water enemas - remains NPO pending GI plans and ongoing sedation as below - MIVF as below   Agitation  Autism, severe, non verbal - required precedex after failed haldol, ativan, and versed - difficult to sedate P:  - cont precedex gtt, with prn versed, haldol, fentanyl prn.  RASS goal  0/-1.  Minimizing as able, ideally avoid narcotics  - cont restraints as needed> will bite, kick etc - holding home zoloft, risperidone, zyprexa till able to take PO's    Seizure, hx of  - per mother, followed by [name], NP.  No seizures in several years, maintained on lamictal BID.  - discussed with Neuro, ok to be off lamictal 4-5 days but if longer will need dose reduction then escalate.    - Started on keppra overnight, switched to depakote per Neurology recs to avoid further behavioral issues -  when able to take PO's> d/c depakote and resume PO lamictal 200mg  BID - seizure precautions    Urinary retention - likely due to sedation +/- constipation  - s/p foley insertion 7/23, remains NPO, unable to start retention meds - once weaning sedation> d/c foley, high risk for traumatic removal per pt despite restraints/ mittens   Hypoglycemia - remains NPO.  Failed multiple attempts with cortrak 7/22 despite aggressive sedation attempts (precedex, versed, fentanyl, benadryl) - change MIVF to LR/D5 at 75 ml/hr - resume diet when safe to do so  Dysphagia - hx of difficulty swallowing tablets and pills.  Any meds must be crushed or in liquid form - aspiration precautions  GERD - cont PPI for now, resume omeprazole when taking POs  Best Practice (right click and "Reselect all SmartList Selections" daily)   Diet/type: NPO DVT prophylaxis: LMWH GI prophylaxis: N/A Lines: N/A Foley:  Yes, and it is still needed Code Status:  full code Last date of multidisciplinary goals of care discussion [full code]  Father updated at bedside 7/23 am.   Labs   CBC: Recent Labs  Lab 02/02/23 1047 02/05/23 1555 02/06/23 0403  WBC 7.9 7.3 5.5  NEUTROABS  --  5.2  --   HGB 16.1 16.3 14.0  HCT 45.0 45.2 40.0  MCV 92.8 93.6 93.7  PLT 202 227 193    Basic Metabolic Panel: Recent Labs  Lab 02/02/23 1047 02/02/23 1419 02/05/23 1555 02/06/23 0403 02/07/23 0053  NA 137 138 137 136 137  K 4.1 4.5 4.3 3.9 4.2  CL 103 103 100 103 108  CO2 17* 23 19* 22 21*  GLUCOSE 149* 97 82 80 92  BUN 9 8 9 11 8   CREATININE 1.03 0.93 1.17 0.97 1.01  CALCIUM 9.9 9.7 9.7 8.9 8.5*  MG  --   --  2.1 1.9  --    GFR: Estimated Creatinine Clearance: 101.8 mL/min (by C-G formula based on SCr of 1.01 mg/dL). Recent Labs  Lab 02/02/23 1047 02/05/23 1555 02/06/23 0403  WBC 7.9 7.3 5.5     Liver Function Tests: Recent Labs  Lab 02/02/23 1047 02/05/23 1555  AST 21 33  ALT 18 21  ALKPHOS 70 79  BILITOT 1.7* 2.3*  PROT 7.8 7.9  ALBUMIN 5.2* 4.7   Recent Labs  Lab 02/02/23 1047 02/05/23 1555  LIPASE 14 29   No results for input(s): "AMMONIA" in the last 168 hours.  ABG No results found for: "PHART", "PCO2ART", "PO2ART", "HCO3", "TCO2", "ACIDBASEDEF", "O2SAT"   Coagulation Profile: No results for input(s): "INR", "PROTIME" in the last 168 hours.  Cardiac Enzymes: No results for input(s): "CKTOTAL", "CKMB", "CKMBINDEX", "TROPONINI" in the last 168 hours.  HbA1C: Hgb A1c MFr Bld  Date/Time Value Ref Range Status  10/20/2009 11:48 AM  4.6 - 6.1 % Final   6.1 (NOTE) The ADA recommends the following therapeutic goal for glycemic control related to Hgb A1c measurement: Goal  of therapy: <6.5 Hgb A1c  Reference: American Diabetes Association: Clinical Practice Recommendations 2010, Diabetes Care, 2010, 33: (Suppl  1).    CBG: Recent Labs  Lab 02/06/23 2031 02/06/23 2321 02/07/23 0502 02/07/23 0853 02/07/23 0925  GLUCAP 138* 88 91 62* 129*   Critical care time: 38 min        Posey Boyer, MSN, NP, AG-ACNP-BC [facility] 02/07/2023, 9:33 AM  See Amion for pager If no response to pager , please call [phone] until 7pm After 7:00 pm call Elink  [phone]

## 2023-02-08 DIAGNOSIS — F840 Autistic disorder: Secondary | ICD-10-CM | POA: Diagnosis not present

## 2023-02-08 DIAGNOSIS — R451 Restlessness and agitation: Secondary | ICD-10-CM | POA: Diagnosis not present

## 2023-02-08 DIAGNOSIS — K56609 Unspecified intestinal obstruction, unspecified as to partial versus complete obstruction: Secondary | ICD-10-CM | POA: Diagnosis not present

## 2023-02-08 DIAGNOSIS — K5900 Constipation, unspecified: Secondary | ICD-10-CM | POA: Diagnosis not present

## 2023-02-08 LAB — BASIC METABOLIC PANEL
Anion gap: 11 (ref 5–15)
BUN: <5 mg/dL — ABNORMAL LOW (ref 6–20)
CO2: 20 mmol/L — ABNORMAL LOW (ref 22–32)
Calcium: 8.2 mg/dL — ABNORMAL LOW (ref 8.9–10.3)
Chloride: 106 mmol/L (ref 98–111)
Creatinine, Ser: 0.85 mg/dL (ref 0.61–1.24)
GFR, Estimated: >60 mL/min (ref >60)
Glucose, Bld: 133 mg/dL — ABNORMAL HIGH (ref 70–99)
Potassium: 3.5 mmol/L (ref 3.5–5.1)
Sodium: 137 mmol/L (ref 135–145)

## 2023-02-08 LAB — GLUCOSE, CAPILLARY
Glucose-Capillary: 121 mg/dL — ABNORMAL HIGH (ref 70–99)
Glucose-Capillary: 134 mg/dL — ABNORMAL HIGH (ref 70–99)
Glucose-Capillary: 100 mg/dL — ABNORMAL HIGH (ref 70–99)
Glucose-Capillary: 116 mg/dL — ABNORMAL HIGH (ref 70–99)
Glucose-Capillary: 136 mg/dL — ABNORMAL HIGH (ref 70–99)

## 2023-02-08 LAB — MAGNESIUM: Magnesium: 1.5 mg/dL — ABNORMAL LOW (ref 1.7–2.4)

## 2023-02-08 MED ORDER — MAGNESIUM SULFATE 4 GM/100ML IV SOLN
4.0000 g | Freq: Once | INTRAVENOUS | Status: AC
  Administered 2023-02-08: 4 g via INTRAVENOUS
  Filled 2023-02-08: qty 100

## 2023-02-08 MED ORDER — CARMEX CLASSIC LIP BALM EX OINT
TOPICAL_OINTMENT | CUTANEOUS | Status: DC | PRN
  Administered 2023-02-08: 1 via TOPICAL
  Filled 2023-02-08: qty 10

## 2023-02-08 MED ORDER — NEOSTIGMINE METHYLSULFATE 10 MG/10ML IV SOLN
2.0000 mg | Freq: Once | INTRAVENOUS | Status: AC
  Administered 2023-02-08: 2 mg via INTRAVENOUS
  Filled 2023-02-08: qty 2

## 2023-02-08 MED ORDER — POTASSIUM CHLORIDE 10 MEQ/100ML IV SOLN
10.0000 meq | INTRAVENOUS | Status: AC
  Administered 2023-02-08 (×4): 10 meq via INTRAVENOUS
  Filled 2023-02-08 (×4): qty 100

## 2023-02-08 NOTE — Progress Notes (Signed)
    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  Neostigmine was started at 1300 today.  Mother politely asked that I not do a physical exam since he recently got cleaned up from his bowel movement and is overstimulated   Objective   Vital signs in last 24 hours: Temp:  [97.7 F (36.5 C)-100.9 F (38.3 C)] 100.9 F (38.3 C) (07/24 1537) Pulse Rate:  [42-124] 101 (07/24 1600) Resp:  [13-23] 17 (07/24 1600) BP: (107-145)/(67-127) 109/68 (07/24 1600) SpO2:  [92 %-100 %] 96 % (07/24 1600) Weight:  [75.4 kg] 75.4 kg (07/24 0500) Last BM Date : 02/07/23 Last BM recorded by nurses in past 5 days Stool Type: Type 6 (Mushy consistency with ragged edges) (02/08/2023  3:00 PM)  General:   male in no acute distress, nonverbal, no respiratory distress  Intake/Output from previous day: 07/23 0701 - 07/24 0700 In: 2751.2 [I.V.:2392.8; IV Piggyback:358.4] Out: 1775 [Urine:1775] Intake/Output this shift: Total I/O In: 1119.1 [I.V.:702.6; IV Piggyback:416.6] Out: 2295 [Urine:2295]  Studies/Results: DG Abd 1 View  Result Date: 02/07/2023 CLINICAL DATA:  Abdominal pain EXAM: ABDOMEN - 1 VIEW COMPARISON:  02/05/2023 FINDINGS: No small bowel dilatation. Progressive gaseous distension of the colon is noted. Colonic bowel loops measure up to 7.6 cm. Paucity of bowel gas noted within the rectum. No abnormal abdominal or pelvic calcifications. IMPRESSION: Progressive gaseous distension of the colon. Electronically Signed   By: Signa Kell M.D.   On: 02/07/2023 14:01    Lab Results: Recent Labs    02/06/23 0403  WBC 5.5  HGB 14.0  HCT 40.0  PLT 193   BMET Recent Labs    02/06/23 0403 02/07/23 0053 02/08/23 0026  NA 136 137 137  K 3.9 4.2 3.5  CL 103 108 106  CO2 22 21* 20*  GLUCOSE 80 92 133*  BUN 11 8 <5*  CREATININE 0.97 1.01  0.85  CALCIUM 8.9 8.5* 8.2*   LFT No results for input(s): "PROT", "ALBUMIN", "AST", "ALT", "ALKPHOS", "BILITOT", "BILIDIR", "IBILI" in the last 72 hours. PT/INR No results for input(s): "LABPROT", "INR" in the last 72 hours.   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  Severe agitation with rectal therapies and inability to swallow pills.  Has not been tolerating p.o. intake for the last week.    Impression/Plan:   Colonic pseudoobstruction/constipation Abdominal x-ray yesterday shows progressive gaseous distention of the colon -Repeat abdominal x-ray tomorrow -Continue neostigmine -If neostigmine is not effective then will consider sedation with propofol and placement of NG tube for bowel prep.  However, would like to avoid if possible to prevent unnecessary risk of sedation  Antonio Silva  02/08/2023, 4:20 PM

## 2023-02-08 NOTE — Progress Notes (Signed)
[facility] ADULT ICU REPLACEMENT PROTOCOL   The patient does apply for the [facility] Adult ICU Electrolyte Replacment Protocol based on the criteria listed below:   1.Exclusion criteria: TCTS, ECMO, Dialysis, and Myasthenia Gravis patients 2. Is GFR >/= 30 ml/min? Yes.    Patient's GFR today is >60 3. Is SCr </= 2? Yes.   Patient's SCr is 0.85 mg/dL 4. Did SCr increase >/= 0.5 in 24 hours? No. 5.Pt's weight >40kg  Yes.   6. Abnormal electrolyte(s):   K 3.5  7. Electrolytes replaced per protocol 8.  Call MD STAT for K+ </= 2.5, Phos </= 1, or Mag </= 1 Physician:  A. Doree Fudge Abbrielle Batts 02/08/2023 4:48 AM

## 2023-02-08 NOTE — Progress Notes (Signed)
NAME:  Antonio Silva, MRN:  [id number], DOB:  [birth year], LOS: 3 ADMISSION DATE:  02/05/2023, CONSULTATION DATE:  02/05/23 REFERRING MD:  EDP, CHIEF COMPLAINT:  agitation req sedation   History of Present Illness:  27 yo male with severe autism presented with constipation and h/o Ogilvie syndrome. Unfortunately pt has been intolerant of therapies to help remedy his constipation and despite multiple doses of anxiolytic and haldol he was ultimately placed on precedex infusion to help with some level of sedation so that he may tolerate enema per GI recommendations.   Pt presented with his mother have 2 day of decreased PO intake and 8 days of no BM. She endorses he has indicated he has abdominal pain. Over the past few days he has become more agitated than usual as well. All history is obtained from EDP, chart and family as pt is unable to provide any. Pt reportedly on stool softener at home, miralax 4x daily, and occasional docusate/senna. Pt has had multiple hospitalizations for disimpaction and has previously been approached about an ileostomy which has not been pursued. 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   Pertinent  Medical History  Autism Non verbal Seizures Gerd anxiety Chronic constipation  Significant Hospital Events: Including procedures, antibiotic start and stop dates in addition to other pertinent events   Admitted on precedex for enemas 7/22 ongoing precedex and multiple prns for suppositories/ enemas, cortrak attempted x2 (w/sedation) unsuccessful.  Foley placed for urinary retention > 1L  Interim History / Subjective:   Precedex remains 0.6 Potassium replaced this morning 7/24 1 bowel movement overnight recorded I/O +2.26 L total   Objective   Blood pressure (!) 132/92, pulse (!) 42, temperature 97.7 F (36.5 C), temperature source Axillary, resp. rate 15, height 5\' 7"  (1.702 m), weight 75.4 kg, SpO2 95%.         Intake/Output Summary (Last 24 hours) at 02/08/2023 0717 Last data filed at 02/08/2023 0600 Gross per 24 hour  Intake 2570.83 ml  Output 1775 ml  Net 795.83 ml   Filed Weights   02/06/23 0500 02/07/23 0500 02/08/23 0500  Weight: 76.4 kg 75.7 kg 75.4 kg   Examination:  General: Young man laying in bed.  No distress but will move, become agitated when stimulated HEENT: Oral clear, no secretions Neuro: Awake, eyes open.  He will track.  Moves all extremities with good strength.  He can vocalize but unintelligible CV: Regular, tachycardic when stimulated but then bradycardic to 50s when calm PULM: Clear bilaterally GI: Soft with hypoactive bowel sounds GU: Foley catheter in place Extremities: No lower extremity edema Skin: No rash    Resolved Hospital Problem list     Assessment & Plan:  Ogilvie syndrome Acute on chronic constipation Abdominal pain with poor PO intake P:  -Appreciate [place] [facility] assistance -Has had bowel movements, no evidence of impaction DRE 7/23 -Enemas and suppositories discontinued -Would like to perform bowel prep from above but enteral access difficult due to his agitation and inability to tolerate NG tube, p.o. intake is poor.  Currently n.p.o.  Should be able to begin to try a diet as we lighten sedation 7/24 -Maintenance IV fluids until taking good p.o -Plan to try neostigmine 2 mg x 1 on 7/24 if we can get him off the Precedex (to avoid bradycardia)  Agitation  Autism, severe, non verbal - required precedex after failed haldol, ativan, and versed - difficult to sedate P:  -Plan to  wean Precedex to off 7/24 if possible -Has required restraints due to impulsive and mellow behavior -Restart his home Zoloft, risperidone, Zyprexa when he is able to take p.o.  Seizure, hx of  - per mother, followed by [name], NP.  No seizures in several years, maintained on lamictal BID.  - discussed with Neuro, ok to be off lamictal 4-5 days but if  longer will need dose reduction then escalate.   -Continue Depakote for now.  Will attempt to transition back to his usual Lamictal 200 g twice daily once he can take oral medications -Seizure precautions  Urinary retention - likely due to sedation +/- constipation  - s/p foley insertion 7/23, remains NPO, unable to start retention meds -Continue Foley catheter for now with plan to remove soon as feasible  Hypoglycemia - remains NPO.  Failed multiple attempts with cortrak 7/22 despite aggressive sedation attempts (precedex, versed, fentanyl, benadryl) -Continue D5 LR 75 cc/h until diet initiated -Start diet when sedation will allow  Dysphagia - hx of difficulty swallowing tablets and pills.  Any meds must be crushed or in liquid form - aspiration precautions  GERD -Continue pantoprazole IV, change back to his home omeprazole when able  Best Practice (right click and "Reselect all SmartList Selections" daily)   Diet/type: NPO DVT prophylaxis: LMWH GI prophylaxis: N/A Lines: N/A Foley:  Yes, and it is still needed Code Status:  full code Last date of multidisciplinary goals of care discussion [full code] Family: Discussed the status with parents at bedside on 7/24   Labs   CBC: Recent Labs  Lab 02/02/23 1047 02/05/23 1555 02/06/23 0403  WBC 7.9 7.3 5.5  NEUTROABS  --  5.2  --   HGB 16.1 16.3 14.0  HCT 45.0 45.2 40.0  MCV 92.8 93.6 93.7  PLT 202 227 193    Basic Metabolic Panel: Recent Labs  Lab 02/02/23 1419 02/05/23 1555 02/06/23 0403 02/07/23 0053 02/08/23 0026  NA 138 137 136 137 137  K 4.5 4.3 3.9 4.2 3.5  CL 103 100 103 108 106  CO2 23 19* 22 21* 20*  GLUCOSE 97 82 80 92 133*  BUN 8 9 11 8  <5*  CREATININE 0.93 1.17 0.97 1.01 0.85  CALCIUM 9.7 9.7 8.9 8.5* 8.2*  MG  --  2.1 1.9  --   --    GFR: Estimated Creatinine Clearance: 121 mL/min (by C-G formula based on SCr of 0.85 mg/dL). Recent Labs  Lab 02/02/23 1047 02/05/23 1555 02/06/23 0403   WBC 7.9 7.3 5.5    Liver Function Tests: Recent Labs  Lab 02/02/23 1047 02/05/23 1555  AST 21 33  ALT 18 21  ALKPHOS 70 79  BILITOT 1.7* 2.3*  PROT 7.8 7.9  ALBUMIN 5.2* 4.7   Recent Labs  Lab 02/02/23 1047 02/05/23 1555  LIPASE 14 29   No results for input(s): "AMMONIA" in the last 168 hours.  ABG No results found for: "PHART", "PCO2ART", "PO2ART", "HCO3", "TCO2", "ACIDBASEDEF", "O2SAT"   Coagulation Profile: No results for input(s): "INR", "PROTIME" in the last 168 hours.  Cardiac Enzymes: No results for input(s): "CKTOTAL", "CKMB", "CKMBINDEX", "TROPONINI" in the last 168 hours.  HbA1C: Hgb A1c MFr Bld  Date/Time Value Ref Range Status  10/20/2009 11:48 AM  4.6 - 6.1 % Final   6.1 (NOTE) The ADA recommends the following therapeutic goal for glycemic control related to Hgb A1c measurement: Goal of therapy: <6.5 Hgb A1c  Reference: American Diabetes Association: Clinical Practice Recommendations 2010, Diabetes  Care, 2010, 33: (Suppl  1).    CBG: Recent Labs  Lab 02/07/23 1201 02/07/23 1522 02/07/23 1959 02/07/23 2345 02/08/23 0358  GLUCAP 134* 95 107* 122* 121*   Critical care time: 32 min      Levy Pupa, MD, PhD 02/08/2023, 7:17 AM [facility] [phone] or if no answer before 7:00PM call [phone] For any issues after 7:00PM please call eLink [phone]

## 2023-02-08 NOTE — Progress Notes (Signed)
Pharmacy Electrolyte Replacement  Recent Labs:  Recent Labs    02/08/23 0026  K 3.5  MG 1.5*  CREATININE 0.85    Low Critical Values (K </= 2.5, Phos </= 1, Mg </= 1) Present: None   Plan: Will supplement with 4g of mag sulfate. Recheck in AM.   Estill Batten, PharmD, BCCCP  Clinical Pharmacist

## 2023-02-09 DIAGNOSIS — F840 Autistic disorder: Secondary | ICD-10-CM | POA: Diagnosis not present

## 2023-02-09 DIAGNOSIS — K5909 Other constipation: Secondary | ICD-10-CM | POA: Diagnosis not present

## 2023-02-09 DIAGNOSIS — R451 Restlessness and agitation: Secondary | ICD-10-CM | POA: Diagnosis not present

## 2023-02-09 DIAGNOSIS — R1084 Generalized abdominal pain: Secondary | ICD-10-CM | POA: Diagnosis not present

## 2023-02-09 LAB — BASIC METABOLIC PANEL
BUN: <5 mg/dL — ABNORMAL LOW (ref 6–20)
CO2: 26 mmol/L (ref 22–32)
Calcium: 8.1 mg/dL — ABNORMAL LOW (ref 8.9–10.3)
Chloride: 102 mmol/L (ref 98–111)
Creatinine, Ser: 0.79 mg/dL (ref 0.61–1.24)
GFR, Estimated: >60 mL/min (ref >60)
Glucose, Bld: 133 mg/dL — ABNORMAL HIGH (ref 70–99)
Potassium: 3.3 mmol/L — ABNORMAL LOW (ref 3.5–5.1)
Sodium: 137 mmol/L (ref 135–145)

## 2023-02-09 LAB — GLUCOSE, CAPILLARY
Glucose-Capillary: 135 mg/dL — ABNORMAL HIGH (ref 70–99)
Glucose-Capillary: 139 mg/dL — ABNORMAL HIGH (ref 70–99)

## 2023-02-09 LAB — MAGNESIUM: Magnesium: 2.2 mg/dL (ref 1.7–2.4)

## 2023-02-09 MED ORDER — OLANZAPINE 5 MG PO TBDP
5.0000 mg | ORAL_TABLET | Freq: Every day | ORAL | Status: DC
  Filled 2023-02-09: qty 1

## 2023-02-09 MED ORDER — DOCUSATE SODIUM 100 MG PO CAPS: 100.0000 mg | ORAL_CAPSULE | Freq: Two times a day (BID) | ORAL | Status: AC | PRN

## 2023-02-09 MED ORDER — LAMOTRIGINE 100 MG PO TABS
200.0000 mg | ORAL_TABLET | Freq: Two times a day (BID) | ORAL | Status: DC
  Administered 2023-02-09: 200 mg via ORAL
  Filled 2023-02-09: qty 2

## 2023-02-09 MED ORDER — POTASSIUM CHLORIDE 10 MEQ/100ML IV SOLN
10.0000 meq | INTRAVENOUS | Status: DC
  Administered 2023-02-09 (×2): 10 meq via INTRAVENOUS
  Filled 2023-02-09 (×2): qty 100

## 2023-02-09 MED ORDER — MAGNESIUM CITRATE PO SOLN: ORAL | Status: AC

## 2023-02-09 MED ORDER — POTASSIUM CHLORIDE CRYS ER 20 MEQ PO TBCR: 20.0000 meq | EXTENDED_RELEASE_TABLET | ORAL | Status: DC

## 2023-02-09 MED ORDER — RISPERIDONE 0.5 MG PO TABS
1.5000 mg | ORAL_TABLET | Freq: Every day | ORAL | Status: DC
  Administered 2023-02-09: 1.5 mg via ORAL
  Filled 2023-02-09: qty 3

## 2023-02-09 MED ORDER — LAMOTRIGINE 200 MG PO TBDP: 200.0000 mg | ORAL_TABLET | Freq: Two times a day (BID) | ORAL | Status: DC

## 2023-02-09 MED ORDER — SENNA 8.6 MG PO TABS: 1.0000 | ORAL_TABLET | Freq: Every day | ORAL | Status: AC | PRN

## 2023-02-09 MED ORDER — POTASSIUM CHLORIDE 10 MEQ/100ML IV SOLN
10.0000 meq | INTRAVENOUS | Status: DC
  Administered 2023-02-09: 10 meq via INTRAVENOUS
  Filled 2023-02-09 (×4): qty 100

## 2023-02-09 MED ORDER — SERTRALINE HCL 20 MG/ML PO CONC
30.0000 mg | Freq: Every morning | ORAL | Status: DC
  Administered 2023-02-09: 30 mg via ORAL
  Filled 2023-02-09 (×2): qty 1.5

## 2023-02-09 MED ORDER — LAMOTRIGINE 100 MG PO TBDP: 200.0000 mg | ORAL_TABLET | Freq: Two times a day (BID) | ORAL | Status: DC

## 2023-02-09 MED ORDER — RISPERIDONE 1 MG/ML PO SOLN
1.5000 mg | Freq: Every day | ORAL | Status: DC
  Filled 2023-02-09: qty 1.5

## 2023-02-09 MED ORDER — POTASSIUM CHLORIDE 10 MEQ/100ML IV SOLN
10.0000 meq | INTRAVENOUS | Status: AC
  Administered 2023-02-09 (×3): 10 meq via INTRAVENOUS

## 2023-02-09 NOTE — TOC Transition Note (Signed)
Transition of Care [facility]) - CM/SW Discharge Note   Patient Details  Name: Antonio Silva MRN: [id number] Date of Birth: [birth year]  [facility] CM/SW Contact:  Tom-Johnson, Hershal Coria, RN Phone Number: 02/09/2023, 1:38 PM   Clinical Narrative:     Patient is scheduled for discharge today.  Readmission Risk Assessment done. Hospital f/u and discharge instructions on AVS. No TOC needs or recommendations noted. Parents to transport at discharge.  No further TOC needs noted.      Final next level of care: Home/Self Care Barriers to Discharge: Barriers Resolved   Patient Goals and CMS Choice CMS Medicare.gov Compare Post Acute Care list provided to:: Patient Choice offered to / list presented to : Patient, Parent  Discharge Placement                  Patient to be transferred to facility by: Parents      Discharge Plan and Services Additional resources added to the After Visit Summary for                  DME Arranged: N/A DME Agency: NA       HH Arranged: NA HH Agency: NA        Social Determinants of Health (SDOH) Interventions SDOH Screenings   Food Insecurity: No Food Insecurity (03/08/2022)   Received from [facility], [facility]  Transportation Needs: No Transportation Needs (03/23/2021)   Received from [facility], [facility]  Financial Resource Strain: Low Risk  (03/08/2022)   Received from [facility], [facility]  Physical Activity: Insufficiently Active (03/08/2022)   Received from [facility], [facility]  Social Connections: Somewhat Isolated (03/08/2022)   Received from [facility], [facility]  Stress: Stress Concern Present (03/08/2022)   Received from [facility], [facility]  Tobacco Use: Low Risk  (02/05/2023)  Health Literacy: High Risk (10/26/2020)   Received from [facility], [facility]     Readmission Risk Interventions    02/06/2023   12:53 PM  Readmission Risk Prevention  Plan  Post Dischage Appt Complete  Medication Screening Complete  Transportation Screening Complete

## 2023-02-09 NOTE — Progress Notes (Signed)
NAME:  WESTON KALLMAN, MRN:  [id number], DOB:  [birth year], LOS: 4 ADMISSION DATE:  02/05/2023, CONSULTATION DATE:  02/05/23 REFERRING MD:  EDP, CHIEF COMPLAINT:  agitation req sedation   History of Present Illness:  27 yo male with severe autism presented with constipation and h/o Ogilvie syndrome. Unfortunately pt has been intolerant of therapies to help remedy his constipation and despite multiple doses of anxiolytic and haldol he was ultimately placed on precedex infusion to help with some level of sedation so that he may tolerate enema per GI recommendations.   Pt presented with his mother have 2 day of decreased PO intake and 8 days of no BM. She endorses he has indicated he has abdominal pain. Over the past few days he has become more agitated than usual as well. All history is obtained from EDP, chart and family as pt is unable to provide any. Pt reportedly on stool softener at home, miralax 4x daily, and occasional docusate/senna. Pt has had multiple hospitalizations for disimpaction and has previously been approached about an ileostomy which has not been pursued. 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   Pertinent  Medical History  Autism Non verbal Seizures Gerd anxiety Chronic constipation  Significant Hospital Events: Including procedures, antibiotic start and stop dates in addition to other pertinent events   Admitted on precedex for enemas 7/22 ongoing precedex and multiple prns for suppositories/ enemas, cortrak attempted x2 (w/sedation) unsuccessful.  Foley placed for urinary retention > 1L  Interim History / Subjective:   Received neostigmine 7/24, small bowel movement  Less distended Starting to eat a little applesauce Precedex was off for part of afternoon, now back on 0.6   Objective   Blood pressure 130/88, pulse (!) 40, temperature 98.1 F (36.7 C), temperature source Axillary, resp. rate 16, height 5\' 7"   (1.702 m), weight 75.4 kg, SpO2 95%.        Intake/Output Summary (Last 24 hours) at 02/09/2023 0721 Last data filed at 02/09/2023 0636 Gross per 24 hour  Intake 2544.86 ml  Output 2935 ml  Net -390.14 ml   Filed Weights   02/07/23 0500 02/08/23 0500 02/09/23 0500  Weight: 75.7 kg 75.4 kg 75.4 kg   Examination:  General: Young gentleman, very mild agitation, HEENT: No oral secretions Neuro: Awake, eyes open and tracks.  Good strength.  Moving all extremities.  Moaning, sometimes attempts to phonate, unintelligible CV: Regular, bradycardic 50s PULM: Clear GI: Soft with hypoactive bowel sounds GU: Foley in place Extremities: No lower extremity edema Skin: No rash   Resolved Hospital Problem list     Assessment & Plan:  Ogilvie syndrome Acute on chronic constipation Abdominal pain with poor PO intake P:  -Appreciate [place] [facility] assistance -neostigmine given 7/23, plan to repeat 7/25 -Enemas and suppositories discontinued -bowel regimen from above will be important when he will take PO. Will not reattempt NGT at this time -Continue Maintenance IV fluids until taking good p.o  Agitation  Autism, severe, non verbal -Back on Precedex overnight -Has continued to require restraints -sooner he can be discharged from the hospital to better with regard to his agitation. Discussed with Father today. May be able to gop home 7/25 if we believe we have maxed benefit here -Restart his home Zoloft, risperidone, Zyprexa when he is able to take p.o.  Seizure, hx of  - per mother, followed by [name], NP.  No seizures in several years, maintained on lamictal BID.  -  Currently on Depakote -Try to restart his Lamictal 200 mg twice daily today 7/25 -Seizure precautions  Urinary retention - likely due to sedation +/- constipation  -Still has a Foley catheter.  Goal to removal on 7/25 if feasible  Hypoglycemia - remains NPO.  Failed multiple attempts with cortrak 7/22 despite  aggressive sedation attempts (precedex, versed, fentanyl, benadryl) -Continue D5 LR 75 cc/h until diet initiated -Try to push p.o. diet with assistance of his parents  Dysphagia - hx of difficulty swallowing tablets and pills.  Any meds must be crushed or in liquid form -Aspiration precautions  GERD -Continue pantoprazole IV, change back to his home omeprazole when able  Best Practice (right click and "Reselect all SmartList Selections" daily)   Diet/type: Regular consistency (see orders) DVT prophylaxis: LMWH GI prophylaxis: N/A Lines: N/A Foley:  Yes, and it is still needed Code Status:  full code Last date of multidisciplinary goals of care discussion [full code] Family: Discussed the status with parents at bedside on 7/25   Labs   CBC: Recent Labs  Lab 02/02/23 1047 02/05/23 1555 02/06/23 0403  WBC 7.9 7.3 5.5  NEUTROABS  --  5.2  --   HGB 16.1 16.3 14.0  HCT 45.0 45.2 40.0  MCV 92.8 93.6 93.7  PLT 202 227 193    Basic Metabolic Panel: Recent Labs  Lab 02/05/23 1555 02/06/23 0403 02/07/23 0053 02/08/23 0026 02/09/23 0059  NA 137 136 137 137 137  K 4.3 3.9 4.2 3.5 3.3*  CL 100 103 108 106 102  CO2 19* 22 21* 20* 26  GLUCOSE 82 80 92 133* 133*  BUN 9 11 8  <5* <5*  CREATININE 1.17 0.97 1.01 0.85 0.79  CALCIUM 9.7 8.9 8.5* 8.2* 8.1*  MG 2.1 1.9  --  1.5* 2.2   GFR: Estimated Creatinine Clearance: 128.5 mL/min (by C-G formula based on SCr of 0.79 mg/dL). Recent Labs  Lab 02/02/23 1047 02/05/23 1555 02/06/23 0403  WBC 7.9 7.3 5.5    Liver Function Tests: Recent Labs  Lab 02/02/23 1047 02/05/23 1555  AST 21 33  ALT 18 21  ALKPHOS 70 79  BILITOT 1.7* 2.3*  PROT 7.8 7.9  ALBUMIN 5.2* 4.7   Recent Labs  Lab 02/02/23 1047 02/05/23 1555  LIPASE 14 29   No results for input(s): "AMMONIA" in the last 168 hours.  ABG No results found for: "PHART", "PCO2ART", "PO2ART", "HCO3", "TCO2", "ACIDBASEDEF", "O2SAT"   Coagulation Profile: No  results for input(s): "INR", "PROTIME" in the last 168 hours.  Cardiac Enzymes: No results for input(s): "CKTOTAL", "CKMB", "CKMBINDEX", "TROPONINI" in the last 168 hours.  HbA1C: Hgb A1c MFr Bld  Date/Time Value Ref Range Status  10/20/2009 11:48 AM  4.6 - 6.1 % Final   6.1 (NOTE) The ADA recommends the following therapeutic goal for glycemic control related to Hgb A1c measurement: Goal of therapy: <6.5 Hgb A1c  Reference: American Diabetes Association: Clinical Practice Recommendations 2010, Diabetes Care, 2010, 33: (Suppl  1).    CBG: Recent Labs  Lab 02/08/23 0752 02/08/23 1134 02/08/23 1534 02/08/23 1943 02/09/23 0404  GLUCAP 134* 100* 116* 136* 135*   Critical care time: 31 min      Levy Pupa, MD, PhD 02/09/2023, 7:21 AM [facility] [phone] or if no answer before 7:00PM call [phone] For any issues after 7:00PM please call eLink [phone]

## 2023-02-09 NOTE — Progress Notes (Addendum)
Antonio Silva to be D/C'd Home per MD order.  Discussed with the patient and all questions fully answered.  VSS, Skin clean, dry and intact without evidence of skin break down, no evidence of skin tears noted. IV catheter discontinued intact. Site without signs and symptoms of complications. Dressing and pressure applied.  An After Visit Summary was printed and given to the patient. Patient received prescription.  D/c education completed with patient/family including follow up instructions, medication list, d/c activities limitations if indicated, with other d/c instructions as indicated by MD - patient able to verbalize understanding, all questions fully answered.   Patient instructed to return to ED, call 911, or call MD for any changes in condition.   Patient escorted via WC, and D/C home via private auto.    Conley Canal [facility] 02/09/2023 2:32 PM

## 2023-02-09 NOTE — Progress Notes (Addendum)
[facility] ADULT ICU REPLACEMENT PROTOCOL   The patient does apply for the [facility] Adult ICU Electrolyte Replacment Protocol based on the criteria listed below:   1.Exclusion criteria: TCTS, ECMO, Dialysis, and Myasthenia Gravis patients 2. Is GFR >/= 30 ml/min? Yes.    Patient's GFR today is >60 3. Is SCr </= 2? Yes.   Patient's SCr is 0.79 mg/dL 4. Did SCr increase >/= 0.5 in 24 hours? No. 5.Pt's weight >40kg  Yes.   6. Abnormal electrolyte(s):   K 3.3  7. Electrolytes replaced per protocol 8.  Call MD STAT for K+ </= 2.5, Phos </= 1, or Mag </= 1 Physician:  A. Macee Venables  Stacey R Ruhl 02/09/2023 4:27 AM   K3.2.  Per protocol, partial enteral and IV repletion.  Patient is unable to take orals due to behavioral problems.  Switch KCl to IV only.  Martavia Tye

## 2023-02-09 NOTE — Progress Notes (Signed)
    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 He states no further progress with bowel movements. Would like to bring him home as he feels the hospital is too overwhelming for the patient and he feels he can get better results if he is able to bring him home and give him magnesium and MiraLAX   Objective   Vital signs in last 24 hours: Temp:  [98.1 F (36.7 C)-100.9 F (38.3 C)] 98.1 F (36.7 C) (07/25 0407) Pulse Rate:  [40-124] 48 (07/25 0800) Resp:  [14-20] 14 (07/25 0800) BP: (107-139)/(68-127) 137/93 (07/25 0800) SpO2:  [92 %-99 %] 94 % (07/25 0800) Weight:  [75.4 kg] 75.4 kg (07/25 0500) Last BM Date : 02/08/23 Last BM recorded by nurses in past 5 days Stool Type: Type 7 (Liquid consistency with no solid pieces) (02/09/2023  9:58 AM)  General:   male in no acute distress, nonverbal, no respiratory distress  Requested not to do a physical exam  Intake/Output from previous day: 07/24 0701 - 07/25 0700 In: 2544.9 [I.V.:1971.4; IV Piggyback:573.5] Out: 2935 [Urine:2935] Intake/Output this shift: No intake/output data recorded.  Studies/Results: No results found.  Lab Results: No results for input(s): "WBC", "HGB", "HCT", "PLT" in the last 72 hours. BMET Recent Labs    02/07/23 0053 02/08/23 0026 02/09/23 0059  NA 137 137 137  K 4.2 3.5 3.3*  CL 108 106 102  CO2 21* 20* 26  GLUCOSE 92 133* 133*  BUN 8 <5* <5*  CREATININE 1.01 0.85 0.79  CALCIUM 8.5* 8.2* 8.1*   LFT No results for input(s): "PROT", "ALBUMIN", "AST", "ALT", "ALKPHOS", "BILITOT", "BILIDIR", "IBILI" in the last 72 hours. PT/INR No results for input(s): "LABPROT", "INR" in the last 72 hours.   Scheduled Meds:  Chlorhexidine Gluconate Cloth  6 each Topical Daily   enoxaparin (LOVENOX) injection  40 mg Subcutaneous Q24H   lamoTRIgine  200 mg Oral BID   OLANZapine zydis  5 mg Oral QHS    pantoprazole (PROTONIX) IV  40 mg Intravenous Q24H   risperiDONE  1.5 mg Oral Daily   sertraline  30 mg Oral q AM   Continuous Infusions:  dexmedetomidine (PRECEDEX) IV infusion 0.6 mcg/kg/hr (02/09/23 0755)   dextrose 5% lactated ringers 75 mL/hr at 02/09/23 0636   potassium chloride 10 mEq (02/09/23 0957)   valproate sodium 500 mg (02/09/23 1010)      Patient profile:   27 year old male with severe autism and longstanding chronic 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 Severe agitation with rectal therapies and inability to swallow pills. Has not been tolerating p.o. intake for the last week.     Impression/Plan   Colonic pseudoobstruction/constipation Abdominal x-ray 7/23 shows progressive gaseous distention of the colon Minimal progress with initiation of neostigmine.  Although patient does appear less agitated -- Patient can go home from a GI standpoint with recommendation of different laxative regimens that are aggressive such as incorporating senna-based laxative/stimulant laxatives every few days in addition to aggressive MiraLAX and fiber.  As well as magnesium citrate frequently.  If worsening distention, pain, or no improvement would recommend coming back to ED  Jude Linck M Remiel Corti  02/09/2023, 11:00 AM

## 2023-02-09 NOTE — Discharge Summary (Signed)
Physician Discharge Summary  Patient ID: Antonio Silva MRN: [id number] DOB/AGE: 1995/10/07 27 y.o.  Admit date: 02/05/2023 Discharge date: 02/09/2023    Discharge Diagnoses:  Principal Problem:   Agitation requiring sedation protocol Active Problems:   Constipation   Autism spectrum disorder   Generalized abdominal pain   Aggressive behavior                                                       D/c plan by Discharge Diagnosis   Brief Summary: Antonio Silva is a 27 yo male with severe autism presented with constipation and h/o Ogilvie syndrome. Unfortunately pt has been intolerant of therapies to help remedy his constipation and despite multiple doses of anxiolytic and haldol he was ultimately placed on precedex infusion to help with some level of sedation so that he may tolerate enema per GI recommendations.    Pt presented with his mother have 2 day of decreased PO intake and 8 days of no BM. She endorses he has indicated he has abdominal pain. Over the past few days he has become more agitated than usual as well. All history is obtained from EDP, chart and family as pt is unable to provide any. Pt reportedly on stool softener at home, miralax 4x daily, and occasional docusate/senna. Pt has had multiple hospitalizations for disimpaction and has previously been approached about an ileostomy which has not been pursued. 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   Course c/b agitation requiring precedex infusion, severe agitation with any attempts at rectal therapies and also c/b inability to swallow pills.   Ogilvie syndrome  Acute on chronic constipation  Poor PO intake  Colonic pseudoobstruction/constipation neostigmine given 7/23  Abdominal x-ray 7/23 shows progressive gaseous distention of the colon Minimal progress with initiation of neostigmine.  From GI-- Patient can go home from a GI standpoint with recommendation of  different laxative regimens that are aggressive such as incorporating senna-based laxative/stimulant laxatives every few days in addition to aggressive MiraLAX and fiber. As well as magnesium citrate frequently. If worsening distention, pain, or no improvement would recommend coming back to ED   Agitation  Autism, severe, non verbal Resume home meds  Outpt PCP f/u   Seizure, hx of  -per mother, followed by [name], NP.  No seizures in several years, maintained on lamictal BID.  -resume home lamictal  -Seizure precautions  Consults: GI   Lines/tubes: N/a  Microbiology/Sepsis markers:   Significant Diagnostic Studies:  CT abd/pelvis 7/18>>> Large amount of liquid stool is seen in colon, more so in left colon and rectum. This may suggest motility dysfunction. There is no wall thickening in colon. There is no pericolic stranding or fluid collection.   There is no evidence of small-bowel obstruction. There is no hydronephrosis.    Vitals:   02/09/23 0800 02/09/23 0900 02/09/23 1000 02/09/23 1100  BP: (!) 137/93 132/80 (!) 133/101 135/84  Pulse: (!) 48 (!) 42 (!) 54 62  Resp: 14 12 12 11   Temp:      TempSrc:      SpO2: 94% 95% 99% 98%  Weight:      Height:         Discharge Labs  BMET Recent Labs  Lab 02/05/23 1555 02/06/23 0403 02/07/23 0053 02/08/23  0026 02/09/23 0059  NA 137 136 137 137 137  K 4.3 3.9 4.2 3.5 3.3*  CL 100 103 108 106 102  CO2 19* 22 21* 20* 26  GLUCOSE 82 80 92 133* 133*  BUN 9 11 8  <5* <5*  CREATININE 1.17 0.97 1.01 0.85 0.79  CALCIUM 9.7 8.9 8.5* 8.2* 8.1*  MG 2.1 1.9  --  1.5* 2.2     CBC  Recent Labs  Lab 02/05/23 1555 02/06/23 0403  HGB 16.3 14.0  HCT 45.2 40.0  WBC 7.3 5.5  PLT 227 193   Anti-Coagulation No results for input(s): "INR" in the last 168 hours.    Discharge Instructions     Call MD for:  persistant nausea and vomiting   Complete by: As directed    Call MD for:  severe uncontrolled pain    Complete by: As directed    Increase activity slowly   Complete by: As directed       Allergies as of 02/09/2023       Reactions   Ketamine Other (See Comments)   CAUSES SEIZURES    Other    Lactose        Medication List     TAKE these medications    B-12 5000 MCG Subl Place 5,000 mcg under the tongue daily.   diazepam 5 MG tablet Commonly known as: VALIUM CRUSH AND GIVE 1 TABLET BY MOUTH UP TO 3 TIMES PER DAY AS NEEDED FOR ANXIETY OR AGITATION What changed:  how much to take how to take this when to take this reasons to take this additional instructions   docusate sodium 100 MG capsule Commonly known as: COLACE Take 1 capsule (100 mg total) by mouth 2 (two) times daily as needed for mild constipation.   lamoTRIgine 200 MG Tbdp DISSOLVE 1 TABLET IN 1 TEASPOON OF WATER AND GIVE ONCE IN THE MORNING AND ONCE AT NIGHT What changed: See the new instructions.   magnesium citrate Soln Daily as needed for severe constipation   OLANZapine zydis 5 MG disintegrating tablet Commonly known as: ZYPREXA Take 5 mg by mouth at bedtime.   omeprazole 20 MG capsule Commonly known as: PRILOSEC TAKE 1 CAPSULE BY MOUTH TWICE A DAY BEFORE A MEAL, CAN OPEN AND PLACE IN FOOD OR DRINK What changed: See the new instructions.   polyethylene glycol powder 17 GM/SCOOP powder Commonly known as: GaviLAX TAKE 17 G BY MOUTH 3 (THREE) TIMES DAILY. What changed:  how much to take how to take this when to take this additional instructions   risperiDONE 1 MG/ML oral solution Commonly known as: RISPERDAL TAKE TWICE A DAY What changed:  how much to take how to take this when to take this additional instructions   senna 8.6 MG Tabs tablet Commonly known as: SENOKOT Take 1 tablet (8.6 mg total) by mouth daily as needed for mild constipation.   sertraline 20 MG/ML concentrated solution Commonly known as: ZOLOFT TAKE 1.5 ML IN THE MORNING What changed:  how much to take how  to take this when to take this   Valtoco 20 MG Dose 10 MG/0.1ML Lqpk Generic drug: diazePAM (20 MG Dose) Give 1 spray into each nostril at the onset of seizure           Follow-up Information     Macy Mis, MD. Schedule an appointment as soon as possible for a visit in 1 week(s).   Specialty: Family Medicine Contact information: [address]  [address] [place] [address] [phone]                  Disposition: Discharge disposition: 01-Home or Self Care     Home   Discharged Condition: DHIREN AZIMI has met maximum benefit of inpatient care and is medically stable and cleared for discharge.  Patient is pending follow up as above.      Time spent on disposition:  Greater than 35 minutes.   Signed: Dirk Dress, NP Pulmonary/Critical Care Medicine  02/09/2023  1:18 PM   See Loretha Stapler for personal pager PCCM on call pager [phone] until 7pm. Please call Elink 7p-7a. [phone]

## 2023-02-10 ENCOUNTER — Other Ambulatory Visit (INDEPENDENT_AMBULATORY_CARE_PROVIDER_SITE_OTHER): Payer: Self-pay | Admitting: Family

## 2023-02-10 ENCOUNTER — Telehealth (INDEPENDENT_AMBULATORY_CARE_PROVIDER_SITE_OTHER): Payer: Self-pay | Admitting: Family

## 2023-02-10 DIAGNOSIS — G40309 Generalized idiopathic epilepsy and epileptic syndromes, not intractable, without status epilepticus: Secondary | ICD-10-CM

## 2023-02-10 NOTE — Telephone Encounter (Signed)
The refill for Valtoco was sent to [facility] at 2:44PM today and the message in Epic says receipt confirmed by pharmacy. Please call [facility] to check and let Mom know please. Thanks, Inetta Fermo

## 2023-02-10 NOTE — Telephone Encounter (Signed)
RX sent to provider

## 2023-02-10 NOTE — Telephone Encounter (Signed)
Contacted patients mother who stated that she's just heard from [facility]. They have to order the medication. Mom stated that was okay, she just called because she hadn't heard anything.   SS, CCMA

## 2023-02-10 NOTE — Telephone Encounter (Signed)
  Name of who is calling: Duard Larsen Relationship to Patient: mom  Best contact number: [phone]  Provider they see: Inetta Fermo   Reason for call: Mom called in they need a refill on his Valtoco and what he has now is expired. He hasn't been able to get his full does of lamotrigine since he hasn't been eating or drinking much for the last week .He was hospitalized for GI problem again, was in ICU for 5 days and no resolution. Please follow up with mom on this      PRESCRIPTION REFILL ONLY  Name of prescription:VALTOCO 20 MG DOSE 10 MG/0.1ML LQPK   Pharmacy: [id number]  [address]

## 2023-02-10 NOTE — Telephone Encounter (Signed)
  Name of who is calling:Donna   Caller's Relationship to Patient: mom  Best contact number: [phone]  Provider they see:  Reason for call: Mom folowing up since she hasn't heard anything from [facility] to be sure the [facility] was [facility].      PRESCRIPTION REFILL ONLY  Name of prescription:  Pharmacy:

## 2023-02-13 ENCOUNTER — Telehealth (INDEPENDENT_AMBULATORY_CARE_PROVIDER_SITE_OTHER): Payer: Self-pay | Admitting: Family

## 2023-02-13 DIAGNOSIS — G40309 Generalized idiopathic epilepsy and epileptic syndromes, not intractable, without status epilepticus: Secondary | ICD-10-CM

## 2023-02-13 MED ORDER — LAMOTRIGINE 200 MG PO TBDP: ORAL_TABLET | ORAL | 0 refills | Status: DC

## 2023-02-13 NOTE — Telephone Encounter (Signed)
Call back to mom Lupita Leash- they have been cutting the 200 mg in half-and then breaking it into smaller pieces trying to get him to take it 2 x a day. She said he is not drinking well some days and have not been giving the whole 1/2 tab (100 mg). They did start doing this 7/25 after the Depakote stopped.

## 2023-02-13 NOTE — Telephone Encounter (Signed)
  Name of who is calling: Neita Goodnight Relationship to Patient: Dad  Best contact number: [phone] or [phone]  Provider they see: [name]  Reason for call: Dad is calling to get prescription sent over to pharmacy and would like a callback with update once it has been sent.       PRESCRIPTION REFILL ONLY  Name of prescription: lameTRIgine 25mg   [facility] [address]

## 2023-02-13 NOTE — Telephone Encounter (Signed)
  Name of who is calling: Duard Larsen Relationship to Patient: mom  Best contact number: [phone]  Provider they see: Inetta Fermo  Reason for call: Need to talk to Inetta Fermo in reference to his Lamotrigine and changes in his medications, please follow up     PRESCRIPTION REFILL ONLY  Name of prescription:  Pharmacy:

## 2023-02-13 NOTE — Telephone Encounter (Signed)
Call back to mom per Inetta Fermo go back to the full tablet he has been on medication a long time and needs to go back to the full dose.  "It is when you first start it but he's been on it for a while so it's OK to get back on track at this point TG I am more worried about him having seizures because the dose is too low "

## 2023-02-13 NOTE — Telephone Encounter (Signed)
Last OV 01/31/2022 follow up was due 01/2023 not scheduled- requested front office call to sched Rx last ordered on 09/01/2022 with 5 refills.

## 2023-02-13 NOTE — Telephone Encounter (Signed)
He was in the hospital and they did not have the sublingual tabs and changed it to Depakote IV. Last dose of Depakote Thur 7/25. Family has been giving half tab of the Lamictal concerned to start at old dose.  Approx wt 170#  She needs guidance on how to restart the Lamictal. Advised he also needs an Appointment it has been over 1 yr but will check with Inetta Fermo and see how and where she wants to see him. Advised she is out of the office today but might be checking her messages this afternoon.  Mom is very appreciative of Inetta Fermo and the care she has given her son. She reports no other doctors are willing to see him and it is difficult to get care for him. RN advised will let Inetta Fermo know.

## 2023-02-14 ENCOUNTER — Encounter (INDEPENDENT_AMBULATORY_CARE_PROVIDER_SITE_OTHER): Payer: Self-pay

## 2023-02-14 NOTE — Telephone Encounter (Signed)
The message says Lamotrigine 25mg  but he hasn't taken that dose in a long time. I sent in a refill for the Lamotrigine 200mg  dispersible tablets. Please let Dad know. Thanks, Inetta Fermo

## 2023-02-16 ENCOUNTER — Telehealth (INDEPENDENT_AMBULATORY_CARE_PROVIDER_SITE_OTHER): Payer: 59 | Admitting: Family

## 2023-02-16 ENCOUNTER — Encounter (INDEPENDENT_AMBULATORY_CARE_PROVIDER_SITE_OTHER): Payer: Self-pay | Admitting: Family

## 2023-02-16 DIAGNOSIS — F840 Autistic disorder: Secondary | ICD-10-CM | POA: Diagnosis not present

## 2023-02-16 DIAGNOSIS — G40309 Generalized idiopathic epilepsy and epileptic syndromes, not intractable, without status epilepticus: Secondary | ICD-10-CM

## 2023-02-16 DIAGNOSIS — F419 Anxiety disorder, unspecified: Secondary | ICD-10-CM

## 2023-02-16 DIAGNOSIS — K5909 Other constipation: Secondary | ICD-10-CM | POA: Diagnosis not present

## 2023-02-16 MED ORDER — DIAZEPAM 5 MG PO TABS
ORAL_TABLET | ORAL | 5 refills | Status: AC
Start: 2023-02-16 — End: ?

## 2023-02-16 MED ORDER — LAMOTRIGINE 200 MG PO TBDP
ORAL_TABLET | ORAL | 5 refills | Status: DC
Start: 2023-02-16 — End: 2023-09-18

## 2023-02-16 NOTE — Patient Instructions (Addendum)
It was a pleasure to see you today!  Instructions for you until your next appointment are as follows: Continue Jamie's medications as prescribed Let me know if seizures occur Please sign up for MyChart if you have not done so. Please plan to return for follow up in 1 year or sooner if needed.  Feel free to contact our office during normal business hours at [phone] with questions or concerns. If there is no answer or the call is outside business hours, please leave a message and our clinic staff will call you back within the next business day.  If you have an urgent concern, please stay on the line for our after-hours answering service and ask for the on-call neurologist.     I also encourage you to use MyChart to communicate with me more directly. If you have not yet signed up for MyChart within [facility], the front desk staff can help you. However, please note that this inbox is NOT monitored on nights or weekends, and response can take up to 2 business days.  Urgent matters should be discussed with the on-call pediatric neurologist.   At [facility], we are committed to providing exceptional care. You will receive a patient satisfaction survey through text or email regarding your visit today. Your opinion is important to me. Comments are appreciated.

## 2023-02-16 NOTE — Progress Notes (Signed)
This is a Pediatric Specialist E-Visit consult/follow up provided via My Chart Video Visit (Caregility). Antonio Silva and his parents Antonio Silva and Antonio Silva consented to an E-Visit consult today.  Is the patient present for the video visit? Yes Location of patient: Antonio Silva is at home Is the patient located in the state of [place]? Yes Location of provider: [name], NP-C is at office Patient was referred by Macy Mis, MD   The following participants were involved in this E-Visit: CMA, NP, patient's mother and father  This visit was done via VIDEO   Chief Complain/ Reason for E-Visit today: seizure follow up Total time on call: 20 min Follow up: 1 year   Antonio Silva   MRN:  [id number]  26-Mar-1996   Provider: [name] NP-C Location of Care: [facility]  Visit type: Return visit  Last visit: 01/31/2022  Referral source: Macy Mis, MD History from: Epic chart and patient's parents  Brief history:  Copied from previous record: History of autism spectrum disorder with limited language, intellectual disability, 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 He is taking and tolerating Lamotrigine and has remained seizure free since April 2020 when he had a seizure in the setting of missed medication. He has ongoing anxiety that is treated with Sertraline. He also takes Hydroxyzine for insomnia, Diazepam as needed for restless behavior and Risperidone for agitation and aggressive behavior.   Today's concerns: Recently seen in the ED at [facility] for abdominal pain and agitation. He is thought to have possible Ogilvie syndrome and has recurrent problems with constipation and episodes of abdominal pain that triggers agitated behavior.  He was admitted to the hospital and because he was NPO, received IV Depakote during the admission. His  parents have restarted the Lamotrigine since discharge and have questions about changing to a new antiepileptic medication to see if his behavior improved. They also report some episodes of staring behavior but are unsure if it is seizure activity or autistic behavior. The staring lasts a few seconds.  Antonio Silva has a behavior of pressing his fingers to his eyes that his parents believe started when the Lamotrigine was initiated many years ago. His previous neurologist (Dr Sharene Skeans) felt that it was likely self stimulatory behavior.  Antonio Silva has ongoing problems with emotional outbursts with self-injurious behavior and aggression. His parents have had difficulty hiring caregivers for him because of his behavior. He is being followed by Tamela Oddi, PA with psychiatry.  Since his recent hospitalization, he has not been eating and drinking as much as usual. He has habit of episodes of urinary retention but will void without intervention after periods of time.  Antonio Silva has been otherwise generally healthy since he was last seen. No health concerns today other than previously mentioned.  Review of systems: Please see HPI for neurologic and other pertinent review of systems. Otherwise all other systems were reviewed and were negative.  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 11 oz. infant born at full-term to a 45 year old primigravida.   Mother gained 30 pounds   Labor lasted for 10 hours   Normal spontaneous vaginal delivery   Delayed developmental milestones particularly for language.   Behavior History autism spectrum disorder, restless, agitated, self-injurious  Surgical history: Past Surgical History:  Procedure Laterality Date   DENTAL RESTORATION/EXTRACTION WITH X-RAY Bilateral 05/27/2021   Procedure: Filling 14-0 Full Mouth Debridement. Fluoride Application.;  Surgeon: Joanna Hews, DMD;  Location: [facility] OR;  Service: Dentistry;  Laterality: Bilateral;   FECAL DISIMPACTION  2018   RADIOLOGY WITH ANESTHESIA N/A 05/27/2021   Procedure: CT ABDOMEN PELVIS WITH ANESTHESIA;  Surgeon: Radiologist, Medication, MD;  Location: [facility] OR;  Service: Radiology;  Laterality: N/A;   WISDOM TOOTH EXTRACTION     age 36     Family history: family history includes Asthma in his mother; Breast cancer in his paternal grandmother; Depression in his mother; Diabetes in his mother; Heart attack in his maternal grandfather and maternal grandmother; Hyperlipidemia in his mother; Hypertension in his mother; Ulcerative colitis in his father.   Social history: Social History   Socioeconomic History   Marital status: Single    Spouse name: Not on file   Number of children: Not on file   Years of education: Not on file   Highest education level: Not on file  Occupational History   Not on file  Tobacco Use   Smoking status: Never   Smokeless tobacco: Never  Vaping Use   Vaping status: Never Used  Substance and Sexual Activity    Alcohol use: Never   Drug use: Never   Sexual activity: Never  Other Topics Concern   Not on file  Social History Narrative   [place] is 27 yo male.   He has caregiver and he goes to their home.    He lives with both parents and has no siblings.    He enjoys attending his day program and walking   Social Determinants of Health   Financial Resource Strain: Low Risk  (03/08/2022)   Received from [facility], [facility]   Overall Financial Resource Strain (CARDIA)    Difficulty of Paying Living Expenses: Not very hard  Food Insecurity: No Food Insecurity (03/08/2022)   Received from [facility], [facility]   Hunger Vital Sign    Worried About Running Out of Food in the Last Year: Never true    Ran Out of Food in the Last Year: Never true  Transportation Needs: No Transportation Needs (03/23/2021)   Received from [facility], [facility]   PRAPARE - Transportation    Lack of Transportation (Medical): No    Lack of Transportation (Non-Medical): No  Physical Activity: Insufficiently Active (03/08/2022)   Received from [facility], [facility]   Exercise Vital Sign    Days of Exercise per Week: 3 days    Minutes of Exercise per Session: 40 min  Stress: Stress Concern Present (03/08/2022)   Received  from [facility], [facility]   [employer] of Occupational Health - Occupational Stress Questionnaire    Feeling of Stress : Very much  Social Connections: Somewhat Isolated (03/08/2022)   Received from [facility], [facility]   Social Network    How would you rate your social network (family, work, friends)?: Restricted participation with some degree of social isolation  Intimate Partner Violence: Not At Risk (03/08/2022)   Received from [facility], [facility]   HITS    Over the last 12 months how often did your partner physically hurt you?: 1    Over the last 12 months how often did your partner insult you or talk down to you?: Not on file    Over the  last 12 months how often did your partner threaten you with physical harm?: Not on file    Over the last 12 months how often did your partner scream or curse at you?: Not on file    Past/failed meds:  Allergies: Allergies  Allergen Reactions   Ketamine Other (See Comments)    CAUSES SEIZURES    Other     Lactose    Immunizations: Immunization History  Administered Date(s) Administered   Influenza,inj,Quad PF,6+ Mos 05/28/2014, 05/18/2015, 05/19/2016, 05/22/2019   PFIZER(Purple Top)SARS-COV-2 Vaccination 09/25/2019, 10/16/2019, 04/28/2020   Tdap 11/21/2017   Diagnostics/Screenings:  Physical Exam: There were no vitals taken for this visit.  Examination was limited by patient's inability to cooperate with video visit. He was seen and heard in the background of the video, moving about his home.  Impression: Generalized convulsive epilepsy (HCC) - Plan: lamoTRIgine 200 MG TBDP  Autism spectrum disorder with accompanying intellectual impairment, requiring very substantial support (level 3) - Plan: diazepam (VALIUM) 5 MG tablet  Anxiety - Plan: diazepam (VALIUM) 5 MG tablet  Other constipation   Recommendations for plan of care: The patient's previous Epic records were reviewed. Recent diagnostic studies performed at his recent hospitalization were reviewed. I talked with his parents about the Lamotrigine and offered an option to reduce the dose slightly but after discussion, they decided to continue the medication for now because of risk of seizure recurrence.  Plan until next visit: Continue medications as prescribed  Call if seizures occur or for questions or concerns Return in about 1 year (around 02/16/2024).  The medication list was reviewed and reconciled. No changes were made in the prescribed medications today. A complete medication list was provided to the patient.  Allergies as of 02/16/2023       Reactions   Ketamine Other (See Comments)   CAUSES SEIZURES    Other     Lactose        Medication List        Accurate as of February 16, 2023 11:59 PM. If you have any questions, ask your nurse or doctor.          B-12 5000 MCG Subl Place 5,000 mcg under the tongue daily.   diazepam 5 MG tablet Commonly known as: VALIUM CRUSH AND GIVE 1 TABLET BY MOUTH UP TO 3 TIMES PER DAY AS NEEDED FOR ANXIETY OR AGITATION What changed:  how much to take how to take this when to take this reasons to take this additional instructions   docusate sodium 100 MG capsule Commonly known as: COLACE Take 1 capsule (100 mg total) by mouth 2 (two) times daily as needed for mild constipation.   lamoTRIgine 200 MG Tbdp Dissolve 1 tablet in 1 teaspoon of  water and give once in the morning and once at night   magnesium citrate Soln Daily as needed for severe constipation   OLANZapine zydis 5 MG disintegrating tablet Commonly known as: ZYPREXA Take 5 mg by mouth at bedtime.   omeprazole 20 MG capsule Commonly known as: PRILOSEC TAKE 1 CAPSULE BY MOUTH TWICE A DAY BEFORE A MEAL, CAN OPEN AND PLACE IN FOOD OR DRINK What changed: See the new instructions.   polyethylene glycol powder 17 GM/SCOOP powder Commonly known as: GaviLAX TAKE 17 G BY MOUTH 3 (THREE) TIMES DAILY. What changed:  how much to take how to take this when to take this additional instructions   risperiDONE 1 MG/ML oral solution Commonly known as: RISPERDAL TAKE TWICE A DAY What changed:  how much to take how to take this when to take this additional instructions   senna 8.6 MG Tabs tablet Commonly known as: SENOKOT Take 1 tablet (8.6 mg total) by mouth daily as needed for mild constipation.   sertraline 20 MG/ML concentrated solution Commonly known as: ZOLOFT TAKE 1.5 ML IN THE MORNING What changed:  how much to take how to take this when to take this   Valtoco 20 MG Dose 10 MG/0.1ML Lqpk Generic drug: diazePAM (20 MG Dose) GIVE 1 SPRAY INTO EACH NOSTRIL AT THE ONSET OF  SEIZURE      Total time spent with the patient was 20 minutes, of which 50% or more was spent in counseling and coordination of care.  [name] NP-C [facility] [address] [place], [place] [address] Ph. [phone] Fax [phone]

## 2023-02-19 ENCOUNTER — Encounter (INDEPENDENT_AMBULATORY_CARE_PROVIDER_SITE_OTHER): Payer: Self-pay | Admitting: Family

## 2023-02-19 DIAGNOSIS — F840 Autistic disorder: Secondary | ICD-10-CM | POA: Insufficient documentation

## 2023-03-13 ENCOUNTER — Telehealth: Payer: Self-pay | Admitting: Gastroenterology

## 2023-03-13 NOTE — Telephone Encounter (Signed)
I appreciate the interest in our practice, however at this time due to high demand from patients we cannot accommodate this transfer.  Unfortunately I don't have any availability on my schedule for new patients until after Jan 2025. We are actively working on recruiting on more providers to help expand care

## 2023-03-13 NOTE — Telephone Encounter (Signed)
Dr. 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 She stated the patient has motility issues is autistic level 3 nonverbal. The patient does have GI history with [facility] and the patients mother did request a transfer of care over to you in the past (2022) which was denied due to high volume of referrals, you stated then she would be able to try back within 6-12 months. She is asking for you to possibly reconsider the transfer of care request for her son.   Please advise on scheduling.  Thank you

## 2023-04-17 ENCOUNTER — Encounter (INDEPENDENT_AMBULATORY_CARE_PROVIDER_SITE_OTHER): Payer: Self-pay

## 2023-04-18 NOTE — Telephone Encounter (Signed)
Routed to Tina. 

## 2023-04-18 NOTE — Telephone Encounter (Signed)
Form completed with medications ordered by our office and placed on Tina's desk.

## 2023-05-18 ENCOUNTER — Other Ambulatory Visit (HOSPITAL_BASED_OUTPATIENT_CLINIC_OR_DEPARTMENT_OTHER): Payer: Self-pay

## 2023-05-18 MED ORDER — FLULAVAL 0.5 ML IM SUSY
0.5000 mL | PREFILLED_SYRINGE | Freq: Once | INTRAMUSCULAR | 0 refills | Status: AC
  Filled 2023-05-18: qty 0.5, 1d supply, fill #0

## 2023-05-18 MED ORDER — COMIRNATY 30 MCG/0.3ML IM SUSY
0.3000 mL | PREFILLED_SYRINGE | Freq: Once | INTRAMUSCULAR | 0 refills | Status: AC
  Filled 2023-05-18: qty 0.3, 1d supply, fill #0

## 2023-08-14 ENCOUNTER — Emergency Department (HOSPITAL_BASED_OUTPATIENT_CLINIC_OR_DEPARTMENT_OTHER): Payer: 59

## 2023-08-14 ENCOUNTER — Other Ambulatory Visit: Payer: Self-pay

## 2023-08-14 ENCOUNTER — Emergency Department (HOSPITAL_BASED_OUTPATIENT_CLINIC_OR_DEPARTMENT_OTHER)
Admission: EM | Admit: 2023-08-14 | Discharge: 2023-08-14 | Disposition: A | Discharge status: Home / Self Care | Payer: 59 | Attending: Emergency Medicine | Admitting: Emergency Medicine

## 2023-08-14 DIAGNOSIS — K5909 Other constipation: Secondary | ICD-10-CM | POA: Diagnosis not present

## 2023-08-14 DIAGNOSIS — R630 Anorexia: Secondary | ICD-10-CM | POA: Diagnosis not present

## 2023-08-14 DIAGNOSIS — F840 Autistic disorder: Secondary | ICD-10-CM | POA: Insufficient documentation

## 2023-08-14 DIAGNOSIS — R5381 Other malaise: Secondary | ICD-10-CM | POA: Diagnosis not present

## 2023-08-14 DIAGNOSIS — R109 Unspecified abdominal pain: Secondary | ICD-10-CM | POA: Diagnosis present

## 2023-08-14 NOTE — ED Notes (Signed)
Unablae to obtain v/s in triage d/t pt irritation

## 2023-08-14 NOTE — ED Triage Notes (Signed)
Pt bib parents, reports flu exposure, emesis yesterday. Parents endorses concern for dehydration. Pt is drinking fluids and keeping down

## 2023-08-14 NOTE — ED Notes (Signed)
Unable to obtain vitals or swab in triage due to patient disability.  Will attempt when patient is roomed.

## 2023-08-14 NOTE — ED Notes (Signed)
Pt nonverbal; parents at bedside advise decreased oral intake and concern for dehydration. Pt does not appear to be in any acute distress.

## 2023-08-14 NOTE — ED Notes (Addendum)
Note was meant for another patient

## 2023-08-14 NOTE — ED Notes (Signed)
Unable to obtain Temp due to pt agitation

## 2023-08-14 NOTE — ED Notes (Signed)
Was not able to get 4 plex swab.

## 2023-08-14 NOTE — ED Provider Notes (Signed)
[place] EMERGENCY DEPARTMENT AT [facility] Provider Note   CSN: [id number] Arrival date & time: 08/14/23  1000     History  Chief Complaint  Patient presents with  . Abdominal Pain    Antonio Silva is a 28 y.o. male with past medical history of epilepsy, autism, GERD, chronic constipation presents to emergency department for evaluation of nasal congestion, decreased appetite, episode of emesis, increased agitation since last Wednesday.  Mother and father at bedside report that his caretakers were tested positive for flu last Wednesday and they are concerned about his decreased appetite and hydration status.  They report that he has had more "aggression and self abusive behaviors" and this is how they typically know something is wrong.  He had some chicken nuggets yesterday but then threw up hours later. Mother also endorses abdominal distension and abdominal "tightness". Family deny fevers.  Other than increased agitation, he is at his baseline which is no functional language.   Abdominal Pain Associated symptoms: vomiting   Associated symptoms: no chest pain, no chills, no constipation, no cough, no diarrhea, no fatigue, no fever, no nausea and no shortness of breath      Home Medications Prior to Admission medications   Medication Sig Start Date End Date Taking? Authorizing Provider  Cyanocobalamin (B-12) 5000 MCG SUBL Place 5,000 mcg under the tongue daily.    [provider]  diazepam (VALIUM) 5 MG tablet CRUSH AND GIVE 1 TABLET BY MOUTH UP TO 3 TIMES PER DAY AS NEEDED FOR ANXIETY OR AGITATION 02/16/23   [name], NP  docusate sodium (COLACE) 100 MG capsule Take 1 capsule (100 mg total) by mouth 2 (two) times daily as needed for mild constipation. 02/09/23   Bernadene Person, NP  lamoTRIgine 200 MG TBDP Dissolve 1 tablet in 1 teaspoon of water and give once in the morning and once at night 02/16/23   [name], NP  magnesium citrate SOLN  Daily as needed for severe constipation 02/09/23   Whiteheart, Mellody Life, NP  OLANZapine zydis (ZYPREXA) 5 MG disintegrating tablet Take 5 mg by mouth at bedtime.    [provider]  omeprazole (PRILOSEC) 20 MG capsule TAKE 1 CAPSULE BY MOUTH TWICE A DAY BEFORE A MEAL, CAN OPEN AND PLACE IN FOOD OR DRINK Patient taking differently: Take 20 mg by mouth 2 (two) times daily before a meal. CAN OPEN AND PLACE IN FOOD OR DRINK 09/01/22   Keturah Shavers, MD  polyethylene glycol powder (GAVILAX) 17 GM/SCOOP powder TAKE 17 G BY MOUTH 3 (THREE) TIMES DAILY. Patient taking differently: Take 17 g by mouth in the morning, at noon, and at bedtime. May give 1 additional dose daily 07/03/20   [name], NP  risperiDONE (RISPERDAL) 1 MG/ML oral solution TAKE TWICE A DAY Patient taking differently: Take 1.5 mg by mouth daily. TAKE 1.5ML ONCE A DAY 01/06/21   [name], NP  senna (SENOKOT) 8.6 MG TABS tablet Take 1 tablet (8.6 mg total) by mouth daily as needed for mild constipation. 02/09/23   Whiteheart, Mellody Life, NP  sertraline (ZOLOFT) 20 MG/ML concentrated solution TAKE 1.5 ML IN THE MORNING Patient taking differently: Take 30 mg by mouth in the morning. TAKE 1.5 ML IN THE MORNING 01/06/21   [name], NP  VALTOCO 20 MG DOSE 10 MG/0.1ML LQPK GIVE 1 SPRAY INTO EACH NOSTRIL AT THE ONSET OF SEIZURE 02/10/23   [name], NP      Allergies    Ketamine  and Other    Review of Systems   Review of Systems  Constitutional:  Negative for chills, fatigue and fever.  HENT:  Positive for congestion.   Respiratory:  Negative for cough, chest tightness, shortness of breath and wheezing.   Cardiovascular:  Negative for chest pain and palpitations.  Gastrointestinal:  Positive for abdominal distention and vomiting. Negative for constipation, diarrhea and nausea.  Neurological:  Negative for dizziness, seizures, weakness, light-headedness, numbness and headaches.    Physical  Exam Updated Vital Signs BP (!) 149/80 (BP Location: Left Arm)   Pulse (!) 117   Resp (!) 22   SpO2 96%  Physical Exam Vitals and nursing note reviewed.  Constitutional:      General: He is not in acute distress.    Appearance: Normal appearance. He is not ill-appearing.     Comments: Ambulating around room. Is not ill appearing  HENT:     Head: Normocephalic and atraumatic.  Eyes:     Conjunctiva/sclera: Conjunctivae normal.  Cardiovascular:     Rate and Rhythm: Normal rate.  Pulmonary:     Effort: Pulmonary effort is normal. No respiratory distress.     Breath sounds: Normal breath sounds.  Abdominal:     General: Bowel sounds are normal. There is distension.     Palpations: Abdomen is soft.     Tenderness: There is no abdominal tenderness. There is no guarding or rebound.  Skin:    General: Skin is warm.     Capillary Refill: Capillary refill takes less than 2 seconds.     Coloration: Skin is not jaundiced or pale.  Neurological:     Mental Status: He is alert. Mental status is at baseline.     Gait: Gait normal.     Comments: Neuro exam difficult d/t pt having difficulty with understanding and following commands. Ambulates without difficulty. At baseline currently per mother.     ED Results / Procedures / Treatments   Labs (all labs ordered are listed, but only abnormal results are displayed) Labs Reviewed  RESP PANEL BY RT-PCR (RSV, FLU A&B, COVID)  [id number]    EKG None  Radiology DG Abdomen 1 View Result Date: 08/14/2023 CLINICAL DATA:  Abdominal distension.  Lack of appetite. EXAM: ABDOMEN - 1 VIEW COMPARISON:  Radiographs 02/07/2023 and 02/05/2023.  CT 02/02/2023. FINDINGS: Generalized colonic distention again noted, similar to previous studies. No supine evidence of free intraperitoneal air or significant colonic stool burden. No suspicious abdominal calcifications or acute osseous findings are seen. IMPRESSION: Persistent generalized colonic distention, similar  to previous studies. No supine evidence of free intraperitoneal air. Electronically Signed   By: Carey Bullocks M.D.   On: 08/14/2023 14:15   DG Chest Portable 1 View Result Date: 08/14/2023 CLINICAL DATA:  Rule out pneumonia. EXAM: PORTABLE CHEST 1 VIEW COMPARISON:  None Available. FINDINGS: No focal consolidation, pleural effusion, pneumothorax. There is shallow inspiration. The cardiac silhouette is within normal limits. No acute osseous pathology. Gaseous distension of the visualized colon. IMPRESSION: No active disease. Electronically Signed   By: Elgie Collard M.D.   On: 08/14/2023 14:14    Procedures Procedures    Medications Ordered in ED Medications - No data to display  ED Course/ Medical Decision Making/ A&P Clinical Course as of 08/14/23 1533  Mon Aug 14, 2023  1532 Pulse Rate(!): 117 While obtaining vital signs, patient becomes agitated and tries to taper off equipment.  However, when not obtaining vital signs, patient is resting comfortably.  No  obvious distress.  No tachypnea [LB]    Clinical Course User Index [LB] Judithann Sheen, PA                                 Medical Decision Making Amount and/or Complexity of Data Reviewed Radiology: ordered.   Patient presents to the ED for concern of abdominal distention, fluid exposure, this involves an extensive number of treatment options, and is a complaint that carries with it a high risk of complications and morbidity.  The differential diagnosis includes SBO/LBO, Ogilvie syndrome, gastroenteritis, chronic constipation   Co morbidities that complicate the patient evaluation  Intellectual debility, autism, chronic constipation   Additional history obtained:  Additional history obtained from Family, Nursing, and Outside Medical Records   External records from outside source obtained and reviewed including  Mother and father at bedside Triage RN note Recent medical evaluations and medication list   Lab  Tests:  I attempted to obtain respiratory panel However prior to completion, patient becomes too agitated.  Ultimately this does not affect our treatment plan as he is well outside of Tamiflu window.  He is well-appearing and I do not think that he needs admission at this time.   Imaging Studies ordered:  I ordered imaging studies including x-ray of abdomen and chest I independently visualized and interpreted imaging which showed  dilated colon.  Similar to prior.   No intra cardiopulmonary activity, pneumonia I agree with the radiologist interpretation    Problem List / ED Course:  Decreased appetite Likely due to  infection. Possibly influenza as he was exposed Able to eat and drink without emesis. No emesis nor diarrhea while in ED While attempting to obtain resp panel d/t flu exposure, patient becomes significantly agitated and has to leave ED. Patient's mother reports that this occurs when patient is absent for long periods of time and due to autism, intellectual disability Discussed importance of continuing adequate hydration status Discussed ED workup, findings, return to emergency department cautions with patient's family expressed understanding agrees with plan.  Discussed follow-up with GI regarding further management of chronic constipation.  All questions answered to her satisfaction.  They are agreeable discharge.  Distended abdomen Related to chronic constipation and chronic dilated bowel No abdominal TTP Well appearing XR significant for chronic colonic dilation similar to prior Hx of chronic constipation and is followed by GI for same - on lax QID    Reevaluation:  After the interventions noted above, I reevaluated the patient and found that they have :stayed the same   Social Determinants of Health:  Has PCP and GI f/u   Dispostion:  After consideration of the diagnostic results and the patients response to treatment, I feel that the patent would benefit  from outpatient management with GI.    Final Clinical Impression(s) / ED Diagnoses Final diagnoses:  Decreased appetite  Malaise  Chronic constipation    Rx / DC Orders ED Discharge Orders     None         Judithann Sheen, PA 08/14/23 1532    Cathren Laine, MD 08/15/23 [phone]

## 2023-08-23 ENCOUNTER — Encounter (INDEPENDENT_AMBULATORY_CARE_PROVIDER_SITE_OTHER): Payer: Self-pay

## 2023-08-23 ENCOUNTER — Telehealth: Payer: 59

## 2023-08-23 DIAGNOSIS — F840 Autistic disorder: Secondary | ICD-10-CM

## 2023-08-23 DIAGNOSIS — G40309 Generalized idiopathic epilepsy and epileptic syndromes, not intractable, without status epilepticus: Secondary | ICD-10-CM

## 2023-08-23 DIAGNOSIS — R1084 Generalized abdominal pain: Secondary | ICD-10-CM

## 2023-08-23 DIAGNOSIS — K5909 Other constipation: Secondary | ICD-10-CM

## 2023-08-23 DIAGNOSIS — K219 Gastro-esophageal reflux disease without esophagitis: Secondary | ICD-10-CM

## 2023-08-30 ENCOUNTER — Ambulatory Visit (INDEPENDENT_AMBULATORY_CARE_PROVIDER_SITE_OTHER): Payer: 59 | Admitting: Family

## 2023-08-30 NOTE — Addendum Note (Signed)
Addended by: Princella Ion on: 08/30/2023 10:29 AM   Modules accepted: Orders

## 2023-09-16 ENCOUNTER — Other Ambulatory Visit (INDEPENDENT_AMBULATORY_CARE_PROVIDER_SITE_OTHER): Payer: Self-pay | Admitting: Family

## 2023-09-16 DIAGNOSIS — G40309 Generalized idiopathic epilepsy and epileptic syndromes, not intractable, without status epilepticus: Secondary | ICD-10-CM

## 2023-09-18 ENCOUNTER — Encounter (INDEPENDENT_AMBULATORY_CARE_PROVIDER_SITE_OTHER): Payer: Self-pay

## 2023-10-15 ENCOUNTER — Other Ambulatory Visit (INDEPENDENT_AMBULATORY_CARE_PROVIDER_SITE_OTHER): Payer: Self-pay | Admitting: Neurology

## 2023-10-15 DIAGNOSIS — K219 Gastro-esophageal reflux disease without esophagitis: Secondary | ICD-10-CM

## 2024-03-20 ENCOUNTER — Other Ambulatory Visit (INDEPENDENT_AMBULATORY_CARE_PROVIDER_SITE_OTHER): Payer: Self-pay | Admitting: Family

## 2024-03-20 DIAGNOSIS — G40309 Generalized idiopathic epilepsy and epileptic syndromes, not intractable, without status epilepticus: Secondary | ICD-10-CM

## 2024-04-17 ENCOUNTER — Other Ambulatory Visit (INDEPENDENT_AMBULATORY_CARE_PROVIDER_SITE_OTHER): Payer: Self-pay | Admitting: Family

## 2024-04-17 DIAGNOSIS — G40309 Generalized idiopathic epilepsy and epileptic syndromes, not intractable, without status epilepticus: Secondary | ICD-10-CM

## 2024-04-21 ENCOUNTER — Encounter (INDEPENDENT_AMBULATORY_CARE_PROVIDER_SITE_OTHER): Payer: Self-pay

## 2024-05-08 ENCOUNTER — Telehealth (INDEPENDENT_AMBULATORY_CARE_PROVIDER_SITE_OTHER): Admitting: Family

## 2024-05-08 ENCOUNTER — Encounter (INDEPENDENT_AMBULATORY_CARE_PROVIDER_SITE_OTHER): Payer: Self-pay | Admitting: Family

## 2024-05-08 VITALS — Wt 175.0 lb

## 2024-05-08 DIAGNOSIS — F840 Autistic disorder: Secondary | ICD-10-CM | POA: Diagnosis not present

## 2024-05-08 DIAGNOSIS — G40309 Generalized idiopathic epilepsy and epileptic syndromes, not intractable, without status epilepticus: Secondary | ICD-10-CM | POA: Diagnosis not present

## 2024-05-08 NOTE — Progress Notes (Unsigned)
 This is a Pediatric Specialist E-Visit consult/follow up provided via My Chart Video Visit (Caregility). Antonio Silva and his parents Antonio and Antonio Silva consented to an E-Visit consult today.  Is the patient present for the video visit? yes Location of patient: Antonio Silva is at home  Is the patient located in the state of [place] ? yes Location of provider: Ellouise Bollman, NP-C is at office Patient was referred by Antonio Luke POUR, MD   The following participants were involved in this E-Visit: CMA, NP, patient's parents   This visit was done via VIDEO   Chief Complain/ Reason for E-Visit today: seizure follow up Total time on call: 15 minutes Follow up: 1 year   Antonio Silva   MRN:  [id number]  Feb 18, 1996   Provider: Ellouise Bollman NP-C Location of Care: [facility]  Visit type: Return visit  Last visit: 02/16/2023  Referral source: Antonio Luke POUR, MD History from: Epic chart and patient's parents  Brief history:  Copied from previous record: History of autism spectrum disorder with limited language, intellectual disability, 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 He is taking and tolerating Lamotrigine  and has remained seizure free since April 2020 when he had a seizure in the setting of missed medication. He has ongoing anxiety that is treated with Sertraline . He also takes Hydroxyzine  for insomnia, Diazepam  as needed for restless behavior and Risperidone  for agitation and aggressive behavior.   Today's concerns: Parents report that Antonio Silva has been doing fairly well. He is attending a day program and does well there Parents are working on finding an AFL provider. They have located someone and are planning for her to come to the home to meet Antonio Silva Parents note some staring spells but it is unclear if it is seizure or self directed behavior They have noted that he is  sometimes wobbly when he gets up in the morning. He has not fallen or fainted but is unsteady walking.  Antonio Silva has ongoing problems with constipation and behavior that Mom believes to be reflux Antonio Silva has been otherwise generally healthy since he was last seen. No health concerns today other than previously mentioned.  Review of systems: Please see HPI for neurologic and other pertinent review of systems. Otherwise all other systems were reviewed and were negative.  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 11 oz. infant born at full-term to  a 27 year old primigravida.   Mother gained 30 pounds   Labor lasted for 10 hours   Normal spontaneous vaginal delivery   Delayed developmental milestones particularly for language.   Behavior History autism spectrum disorder, restless, agitated,  self-injurious  Surgical history: Past Surgical History:  Procedure Laterality Date   DENTAL RESTORATION/EXTRACTION WITH X-RAY Bilateral 05/27/2021   Procedure: Filling 14-0 Full Mouth Debridement. Fluoride Application.;  Surgeon: Bonnell Margarito DEL, DMD;  Location: [facility] OR;  Service: Dentistry;  Laterality: Bilateral;   FECAL DISIMPACTION  2018   RADIOLOGY WITH ANESTHESIA N/A 05/27/2021   Procedure: CT ABDOMEN PELVIS WITH ANESTHESIA;  Surgeon: Radiologist, Medication, MD;  Location: [facility] OR;  Service: Radiology;  Laterality: N/A;   WISDOM TOOTH EXTRACTION     age 13     Family history: family history includes Asthma in his mother; Breast cancer in his paternal grandmother; Depression in his mother; Diabetes in his mother; Heart attack in his maternal grandfather and maternal grandmother; Hyperlipidemia in his mother; Hypertension in his mother; Ulcerative colitis in his father.   Social history: Social History   Socioeconomic History   Marital status: Single    Spouse name: Not on file   Number of children: Not on file   Years of education: Not on file   Highest education level: Not on file  Occupational History   Not on file  Tobacco Use   Smoking status: Never   Smokeless tobacco: Never  Vaping Use   Vaping status: Never Used  Substance and Sexual Activity   Alcohol use: Never   Drug use: Never   Sexual activity: Never  Other Topics Concern   Not on file  Social History Narrative   [name] is 28 yo male.   He has caregiver and he goes to their home.    He lives with both parents and has no siblings.    He enjoys attending his day program and walking   Social Drivers of Health   Financial Resource Strain: Patient Declined (03/23/2024)   Received from [facility]   Overall Financial Resource Strain (CARDIA)    How hard is it for you to pay for the very basics like food, housing, medical care, and heating?: Patient declined  Food Insecurity: Patient Declined (03/23/2024)    Received from [facility]   Hunger Vital Sign    Within the past 12 months, you worried that your food would run out before you got the money to buy more.: Patient declined    Within the past 12 months, the food you bought just didn't last and you didn't have money to get more.: Patient declined  Transportation Needs: Patient Declined (03/23/2024)   Received from [facility] - Transportation    In the past 12 months, has lack of transportation kept you from medical appointments or from getting medications?: Patient declined    In the past 12 months, has lack of transportation kept you from meetings, work, or from getting things needed for daily living?: Patient declined  Physical Activity: Insufficiently Active (03/23/2024)   Received from [facility]   Exercise Vital Sign    On average, how many days per week do you engage in moderate to strenuous exercise (like a brisk walk)?: 2 days    On average, how many minutes do you engage in exercise at this level?: 30 min  Stress: Patient Declined (03/23/2024)   Received from [facility] - Occupational Stress Questionnaire  Do you feel stress - tense, restless, nervous, or anxious, or unable to sleep at night because your mind is troubled all the time - these days?: Patient declined  Social Connections: Moderately Integrated (03/23/2024)   Received from [facility]   Social Network    How would you rate your social network (family, work, friends)?: Adequate participation with social networks  Intimate Partner Violence: Patient Declined (03/23/2024)   Received from [facility]   HITS    Over the last 12 months how often did your partner physically hurt you?: Patient declined    Over the last 12 months how often did your partner insult you or talk down to you?: Patient declined    Over the last 12 months how often did your partner threaten you with physical harm?: Patient declined    Over  the last 12 months how often did your partner scream or curse at you?: Patient declined    Past/failed meds:  Allergies: Allergies  Allergen Reactions   Ketamine Other (See Comments)    CAUSES SEIZURES    Other     Lactose    Immunizations: Immunization History  Administered Date(s) Administered   Influenza, Seasonal, Injecte, Preservative Fre 05/18/2023   Influenza,inj,Quad PF,6+ Mos 05/28/2014, 05/18/2015, 05/19/2016, 05/22/2019   PFIZER(Purple Top)SARS-COV-2 Vaccination 09/25/2019, 10/16/2019, 04/28/2020   Pfizer(Comirnaty )Fall Seasonal Vaccine 12 years and older 05/18/2023   Tdap 11/21/2017    Diagnostics/Screenings:  Physical Exam: Wt 175 lb (79.4 kg)   BMI 27.41 kg/m   Examination was limited by patients inability to cooperate with the video visit format. General: well developed, well nourished man, seated at home with his family, in no evident distress Head: normocephalic and atraumatic. No dysmorphic features. Neck: supple Musculoskeletal: No skeletal deformities or obvious scoliosis Skin: no rashes or neurocutaneous lesions  Neurologic Exam Mental Status: Awake and fully alert. Attends only briefly to the video visit Cranial Nerves: Turns to localize faces, objects and sounds in the periphery. Face, tongue, palate move normally and symmetrically. Motor: Normal functional bulk, tone and strength Sensory: Withdrawal x 4 Coordination: No dysmetria with reach for objects  Impression: Generalized convulsive epilepsy (HCC)  Autism spectrum disorder   Recommendations for plan of care: The patient's previous Epic records were reviewed. No recent diagnostic studies to be reviewed with the patient. I talked with parents about the staring spells and ways to try to get his attention. I recommended trying to increase his water intake to see if that helps with symptoms they see in the mornings. Plan until next visit: Continue medications as prescribed  Encourage more  hydration Call for questions or concerns Return in about 1 year (around 05/08/2025).  The medication list was reviewed and reconciled. No changes were made in the prescribed medications today. A complete medication list was provided to the patient.  Allergies as of 05/08/2024       Reactions   Ketamine Other (See Comments)   CAUSES SEIZURES    Other    Lactose        Medication List        Accurate as of May 08, 2024  9:41 AM. If you have any questions, ask your nurse or doctor.          B-12 5000 MCG Subl Place 5,000 mcg under the tongue daily.   diazepam  5 MG tablet Commonly known as: VALIUM  CRUSH AND GIVE 1 TABLET BY MOUTH UP TO 3 TIMES PER DAY AS NEEDED FOR ANXIETY OR AGITATION  docusate sodium  100 MG capsule Commonly known as: COLACE Take 1 capsule (100 mg total) by mouth 2 (two) times daily as needed for mild constipation.   lamoTRIgine  200 MG Tbdp DISSOLVE 1 TABLET IN TEASPOON OF WATER EVERY MORNING AND AT NIGHT   magnesium  citrate Soln Daily as needed for severe constipation   OLANZapine  zydis 5 MG disintegrating tablet Commonly known as: ZYPREXA  Take 5 mg by mouth at bedtime.   omeprazole  20 MG capsule Commonly known as: PRILOSEC TAKE 1 CAPSULE BY MOUTH TWICE A DAY BEFORE A MEAL, CAN OPEN AND PLACE IN FOOD OR DRINK What changed: See the new instructions.   polyethylene glycol powder 17 GM/SCOOP powder Commonly known as: GaviLAX TAKE 17 G BY MOUTH 3 (THREE) TIMES DAILY. What changed:  how much to take how to take this when to take this additional instructions   risperiDONE  1 MG/ML oral solution Commonly known as: RISPERDAL  TAKE TWICE A DAY What changed:  how much to take how to take this when to take this additional instructions   senna 8.6 MG Tabs tablet Commonly known as: SENOKOT Take 1 tablet (8.6 mg total) by mouth daily as needed for mild constipation.   sertraline  20 MG/ML concentrated solution Commonly known as:  ZOLOFT  TAKE 1.5 ML IN THE MORNING What changed:  how much to take how to take this when to take this   Valtoco  20 MG Dose 10 MG/0.1ML Lqpk Generic drug: diazePAM  (20 MG Dose) GIVE 1 SPRAY INTO EACH NOSTRIL AT THE ONSET OF SEIZURE      Total time spent with the patient was 15 minutes, of which 50% or more was spent in counseling and coordination of care.  Ellouise Bollman NP-C [facility] [address] [place], [place] [address] Ph. [phone] Fax [phone]

## 2024-05-08 NOTE — Patient Instructions (Signed)
 It was a pleasure to see you today!  Instructions for you until your next appointment are as follows: Continue Jamie's medications as prescribed Let me know if the staring spells become longer or more concerning for seizure Encourage him to drink fluids in the mornings if he will do so Let me know if you need anything for his transition to an AFL provider Please sign up for MyChart if you have not done so. Please plan to return for follow up in 1 year  or sooner if needed.  Feel free to contact our office during normal business hours at [phone] with questions or concerns. If there is no answer or the call is outside business hours, please leave a message and our clinic staff will call you back within the next business day.  If you have an urgent concern, please stay on the line for our after-hours answering service and ask for the on-call neurologist.     I also encourage you to use MyChart to communicate with me more directly. If you have not yet signed up for MyChart within [facility], the front desk staff can help you. However, please note that this inbox is NOT monitored on nights or weekends, and response can take up to 2 business days.  Urgent matters should be discussed with the on-call pediatric neurologist.   At [facility], we are committed to providing exceptional care. You will receive a patient satisfaction survey through text or email regarding your visit today. Your opinion is important to me. Comments are appreciated.

## 2024-05-09 ENCOUNTER — Encounter (INDEPENDENT_AMBULATORY_CARE_PROVIDER_SITE_OTHER): Payer: Self-pay | Admitting: Family

## 2024-05-14 ENCOUNTER — Other Ambulatory Visit (INDEPENDENT_AMBULATORY_CARE_PROVIDER_SITE_OTHER): Payer: Self-pay | Admitting: Family

## 2024-05-14 DIAGNOSIS — G40309 Generalized idiopathic epilepsy and epileptic syndromes, not intractable, without status epilepticus: Secondary | ICD-10-CM

## 2024-07-16 ENCOUNTER — Encounter (INDEPENDENT_AMBULATORY_CARE_PROVIDER_SITE_OTHER): Payer: Self-pay

## 2024-07-16 DIAGNOSIS — G40309 Generalized idiopathic epilepsy and epileptic syndromes, not intractable, without status epilepticus: Secondary | ICD-10-CM

## 2024-07-16 DIAGNOSIS — Z79899 Other long term (current) drug therapy: Secondary | ICD-10-CM

## 2024-07-16 NOTE — Progress Notes (Addendum)
 "  Annual Preventive Exam  Subjective   Antonio Silva is a 28 y.o. (DOB 1996-04-20) male.     Patient presents with   Medicare Wellness Visit    Accompanied by mom and dad, Mother advised no new concerns but still have the same gut problems as in the past, seeing GI for the issue      Health Maintenance  Topic Date Due   Medicare Annual Wellness  07/16/2025   DTaP/Tdap/Td Vaccines (7 - Td or Tdap) 11/22/2027   Zoster Vaccine (1 of 2) 08/02/2044   Influenza Vaccine  Completed   COVID-19 Vaccine  Completed   Hepatitis A Vaccine  Completed   HPV Vaccine (No Doses Required) Completed   Meningococcal B Vaccine  Aged Out   Pneumococcal Vaccine: Pediatrics and At Risk Patients  Aged Out   Meningococcal Conjugate Vaccine  Aged Out   Immunization History  Administered Date(s) Administered   Pfizer COVID-19 12+ years (Comirnaty ) 05/18/2023   Pfizer COVID-19 Vaccine Monovalent 12+ El[place] 04/28/2020   Pfizer-BioNTech COVID-19 Monovalent Vaccine mRNA 09/25/2019, 10/16/2019    Lifestyle:  Body mass index is 30.51 kg/m. 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 Sable     Allergies Patient is allergic to ketamine and lactose.  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  Past Surgical History Patient  has a past surgical history that includes Dental surgery.  Social History Patient   reports that he has never smoked. He has never been exposed to tobacco smoke. He has never used smokeless tobacco. He reports that he does not drink alcohol and does not use drugs.  Family History Patient's family history includes Diabetes in his mother; Hypertension in his mother; Ulcerative colitis in his father.  Review of Systems  Pertinent Positives: see a/p ROS is otherwise negative   Objective   Temp 97.6 F (36.4 C) (Temporal)   Resp 18   Wt 194 lb 12.8 oz (88.4 kg) Comment: Pt unreceptive unable to obtain at this time  BMI 30.51 kg/m   General: The patient is a 28 y.o. male who appears to be in no acute distress. Psych: He is alert and pacing HEENT: Normocephalic, atraumatic, non-icteric sclera Neck:  Supple.  Trachea is in midline position.  The neck is without adenopathy, masses, or thyromegaly. Lungs:  Good breath sounds are noted bilaterally.  The lungs are clear to auscultation bilaterally.  The spine and CVA region are  nontender to palpation. Cardiovascular:  Regular rate and rhythm without gallop, rub, or murmur.  Abdomen:  Bowel sounds are physiologic.  The abdomen is soft and nontender to palpation.  No masses are noted.  No hepatosplenomegaly is noted.  Skin:  No rashes or worrisome lesions are noted.  Extremities:  The extremities are without cyanosis or significant contusions.  Pitting edema is not noted.  ROM is normal in all four extremities. Neuro:  Mental status is normal.  CN 2-11 are grossly normal.  Motor and sensory  exams are grossly normal.  Gait is stable.   Assessment/Plan    Annual Preventive Exam Health maintenance issues including appropriate cancer screening, healthy diet, exercise, immunizations, and tobacco avoidance/cessation were discussed with the patient.   Discussed the patient's BMI and appropriate lifestyle activities to maintain target BMI were encouraged. Routine labs and screening tests ordered.   In addition:  Generalized  abdominal pain Working with multiple GI specialists- has been difficult to evaluate due to nonverbal.  Managing currently with miralax , Senna prn and omeprazole .  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  Rec metabolic screening with next labs under sedation.  Orders provided today  Generalized convulsive epilepsy (*)  Remains stable under care of neurology- will coordinate with them if they would like any additional monitoring labs  Fatigue, unspecified type  No cough, dyspnea or chest pain.  No drowsiness but seems reluctant to be as active as before.  Will check labs.  Written patient instructions printed and documented in After Visit [place]  Orders Placed This Encounter  Procedures   H. Pylori Antigen, Stool Stool Stool   CBC And Differential   Comprehensive Metabolic Panel   Lipid Panel   TSH Rfx on Abnormal to Free T4   Hemoglobin A1c   Vitamin D 25 Hydroxy   Vitamin B12   Iron and TIBC    Medications at end of visit: Current Medications[1]   Medicare AWV  Antonio Silva is a 28 y.o. male who presents for his subsequent annual wellness visit for Medicare.  Clinical documentation was reviewed and is accessible via encounter-level attachments.    Any physical exam components or additional concerns beyond the scope of the Annual Wellness Visit may be documented in a separate note within this encounter.  Medicare Required Components     Reviewed and updated this visit by provider: Tobacco  Allergies  Meds  Problems  Med Hx  Surg Hx  Fam Hx       Medicare required assessment: Future risk of substance use disorder / overdose risk of 1% is lower (<1.3%).    Patient Care Team: Luke MARLA Manns, MD as PCP - General (Family Medicine) Tami JONELLE Medal, MD as  Consulting Physician (Gastroenterology) Gerardine KANDICE Dandy, MD as Consulting Physician (Gastroenterology)  Vitals   Vitals:   07/16/24 0940  BP: Comment: Pt unreceptive to getting BP unable to obtain at this time  Pulse: Comment: Pt unreceptive  Temp: 97.6 F (36.4 C)  TempSrc: Temporal  Resp: 18  Height: Comment: Pt unreceptive unable to obtain at this time  Weight: 194 lb 12.8 oz (88.4 kg) Comment: Pt unreceptive unable to obtain at this time  SpO2: Comment: Pt unreceptive unable to obtain at this time    Disposition   1. Medicare annual wellness visit, subsequent (Primary) 2. Annual physical exam 3. Fatigue, unspecified type Assessment & Plan:  No cough, dyspnea or chest pain.  No drowsiness but seems reluctant to be as active as before.  Will check labs. Orders: -     CBC And Differential; Future -     Comprehensive Metabolic Panel; Future -     Lipid Panel; Future -     TSH Rfx on Abnormal to Free T4; Future -     Hemoglobin A1c; Future -     Vitamin D 25  Hydroxy; Future -     Vitamin B12; Future -     Iron and TIBC; Future 4. Medication monitoring encounter -     CBC And Differential; Future -     Comprehensive Metabolic Panel; Future -     Lipid Panel; Future -     TSH Rfx on Abnormal to Free T4; Future -     Hemoglobin A1c; Future -     Vitamin D 25 Hydroxy; Future -     Vitamin B12; Future -     Iron and TIBC; Future 5. Generalized convulsive epilepsy (*) Assessment & Plan:  Remains stable under care of neurology- will coordinate with them if they would like any additional monitoring labs 6. Gastroesophageal reflux disease, unspecified whether esophagitis present -     H. Pylori Antigen, Stool Stool Stool; Future 7. Generalized abdominal pain Assessment & Plan: Working with multiple GI specialists- has been difficult to evaluate due to nonverbal.  Managing currently with miralax , Senna prn and omeprazole .  Will check H pylori stool No previous H pylori  testing 8. Urinary incontinence, unspecified type Assessment & Plan:  Continued long term need for incontinence supplies 9. Autism spectrum disorder requiring very substantial support (level 3) (*) Assessment & Plan:  Continued care with behavioral health.  Rec metabolic screening with next labs under sedation.  Orders provided today   H & P complete  Follow up in about 1 year (around 07/16/2025).   Health maintenance issues were discussed with the patient.  A written plan was provided to the patient in the form of patient instructions in the After Visit Summary document.          [1]  Current Outpatient Medications:    diazepam  (VALIUM ) 5 mg tablet, TAKE 1 TABLET EVERY 6 HOURS AS NEEDED FOR ANXIETY, Disp: , Rfl: 5   FIRST-OMEPRAZOLE  2 MG/ML SUSP, Take 40 mg by mouth daily., Disp: 600 mL, Rfl: 5   lamoTRIgine  (LAMICTAL ) 200 MG tablet, Take one tablet (200 mg dose) by mouth 2 (two) times daily., Disp: , Rfl:    Multiple Vitamins-Minerals (CENTRUM ADULTS PO), Take by mouth., Disp: , Rfl:    OLANZapine  zydis (ZYPREXA ) 5 mg disintegrating tablet, Take one tablet (5 mg dose) by mouth at bedtime., Disp: , Rfl:    polyethylene glycol (MIRALAX ,GAVILAX,CLEARLAX) 17 GM/SCOOP powder, MIX AND DRINK 17 GRAMS OF POWDER BY MOUTH THREE TIMES A DAY, Disp: 1530 g, Rfl: 5   risperiDONE  (RISPERDAL ) 1 mg/mL oral solution, TAKE 1.5MLS EVERY MORNING AT 7AM AND AT 5PM, Disp: , Rfl: 5   sertraline  (ZOLOFT ) 20 mg/mL concentrated solution, Take 1.5 mLs (30 mg dose) by mouth daily., Disp: , Rfl:  "

## 2024-07-17 ENCOUNTER — Ambulatory Visit: Payer: Self-pay | Admitting: Dentistry

## 2024-07-17 DIAGNOSIS — F840 Autistic disorder: Secondary | ICD-10-CM

## 2024-07-19 NOTE — Progress Notes (Addendum)
 Anesthesia Chart Review: Same day workup  29 year old male with history of autism spectrum disorder with limited language, intellectual disability, 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  Per notes, he is taking and tolerating Lamotrigine  and has remained seizure free since April 2020 when he had a seizure in the setting of missed medication.   He was seen by PCP Dr. Luke Manns on 07/16/2024 for general follow-up and preop evaluation.  Epilepsy noted to be stable.  He continues to have issues with generalized abdominal pain and constipation, working with multiple GI specialists, currently managed with MiraLAX , omeprazole , senna as needed.  Comprehensive labs were ordered to be performed under anesthesia concomitant with dental procedure.  Pt with history of combativeness/agitation prior to surgery. History summarized below: 04/10/2019 [facility]:   Gastrograffin Enema for severe constipation   Pre-op anesthesiologist's notes indicated reported possible seizure after ketamine administration in 2016, but no detail documented in anesthesia record; allowed PIV in ER 2019   Anesthesia Plan:  11:21 AM Anesthesiologist Documentation: Antonio MICAEL Brothers, MD  IVGA Pt extremely agitated upon arrival to RPPU, unable to even attempt IV placement, o/w inhalational indxn w/ restraint (father/mother agree to this, they are very aware of how challenging the situation is and did this last time) Does not feel attempt at intra-nasal precedex  is reasonable, says he will not tolerate. MM n/a N95 due to COVID testing refusal and unable to wear mask due to sensory issues related to autism)Anesthetic plan and risks discussed with patient, father and mother. ASA 3  General   1136: Anesthesiologist Documentation: Antonio MICAEL Brothers, MD 04/10/2019 / 12:05 PM Pt brought to Fluoro suite w/ parents and RPPU RN escort. Attempted to placate pt w/ videos but unable to get pt to sit  on stretcher. Pt becoming increasingly agitated, eventually was restrained by RN and father, Sevo 8% inhalational indxn successful, pt quickly laid on stretcher and mask ventilation was established, SpO2 100%, 18g PIV R forearm established, pt intubated w/o issue. Moved to procedure table... 1251: Pt to PACU on 4L [place]. Monitors connected, VSS. Airway patent, pain controlled. Emerged, extubated, transported to PACU w/o event. Father awaiting pt in PACU.    06/17/2016 [facility]:   Perineal exam under anesthesia, anesthetic block of the anus with Exparel, anorectal stool disimpaction of over 30 minutes of calcified stool    Mom reported he may be combative in flight or fight mode and to have extra people on hand. Needs to be sedated.    Anesthesia Plan: Pt had seizure activity after last GA with a ketamine dart. Pt would not take his po valium  this am for his parents. Tried to get pt to drink some po versed  mixed with sugar to try and alleviate the bitterness, but he would not take any. Plan to take pt back to the OR with parents for mask induction.   1410: Patient walked to OR with parents and OR staff. Mask inhalation induction. IV placed in left arm. Breathing spontaneously with mask support. VSS... 1453: Case longer than expected. LMA placed. Easy LMA placement. BBS = with good air exchange. Mouth and teeth in preoperative condition. VSS.   05/28/2015 [facility]   Dental surgeon, molar extractions   Parents gave Valium  prior to arrival 1050: 350 ( 4mg /kg ) mg IM ketamine given in preop, pt extremely anxious and combative. Ketamine took @ 10 minutes to work. Pt then placed on bed and brought back to OR 40. 20g PIV placed in  left hand. IV induction 7.0    ETT: 6.5; Cuffed, Nasal, RAE; Easy; No; Laryngoscope; Miller; 2; 1; +EtCo2, Breathing Equal and Symmetric; None; Nasal; 1   05/28/15:  Final Anesthetic Type: General  Patient Evaluation Location: PACU  Patient Participation: Patient participated  Level  of Consciousness: awake and alert and oriented  Pain Management: adequately controlled during entire PACU stay  Nausea/Vomiting: None  Post-op vitals signs: Stable  Post op temperature: Normothermic  Cardiovascular Status: hemodynamically stable  Respiratory Status: Stable, room air, spontaneous  Postoperative Hydration: adequately hydrated  Post op disposition: Home  Anesthetic Complications: no anesthesia complications noted  Additional Comments: Sleepy, eyes open, calm. Mom says she is good with d/c  Electronically signed by: Antonio Fairy Rummer, MD 05/28/15 1504     Most recently, pt required general anesthesia for dental work on 05/27/21. Per documentation by anesthesiologist Dr. Paul, Patient and his parents met in parking lot because parents were unable to get him out of the car by themselves due to patient resistance/refusal. Patient was prescribed Halcion to take prior to arrival but refused all meds this morning (including antiepileptic). Hospital security officers present to assist as well as preop RN and CRNAs. Security able to get patient out of car with some resistance and brought to short stay. Patient refused attempt at oral versed . Plan discussed with parents. Will attempt intranasal precedex . If needed, will give additional sedation via IM route.     [facility] Short Stay Center/Anesthesiology Phone [phone] 07/19/2024 10:12 AM

## 2024-07-19 NOTE — Progress Notes (Incomplete Revision)
 Anesthesia Chart Review: Same day workup  29 year old male with history of autism spectrum disorder with limited language, intellectual disability, 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  Per notes, he is taking and tolerating Lamotrigine  and has remained seizure free since April 2020 when he had a seizure in the setting of missed medication.   He was seen by PCP Dr. Luke Manns on 07/16/2024 for general follow-up and preop evaluation.  Epilepsy noted to be stable.  He continues to have issues with generalized abdominal pain and constipation, working with multiple GI specialists, currently managed with MiraLAX , omeprazole , senna as needed.  Comprehensive labs were ordered to be performed under anesthesia concomitant with dental procedure.  Pt with history of combativeness/agitation prior to surgery. History summarized below: 04/10/2019 [facility]:   Gastrograffin Enema for severe constipation   Pre-op anesthesiologist's notes indicated reported possible seizure after ketamine administration in 2016, but no detail documented in anesthesia record; allowed PIV in ER 2019   Anesthesia Plan:  11:21 AM Anesthesiologist Documentation: Ray MICAEL Brothers, MD  IVGA Pt extremely agitated upon arrival to RPPU, unable to even attempt IV placement, o/w inhalational indxn w/ restraint (father/mother agree to this, they are very aware of how challenging the situation is and did this last time) Does not feel attempt at intra-nasal precedex  is reasonable, says he will not tolerate. MM n/a N95 due to COVID testing refusal and unable to wear mask due to sensory issues related to autism)Anesthetic plan and risks discussed with patient, father and mother. ASA 3  General   1136: Anesthesiologist Documentation: Ray MICAEL Brothers, MD 04/10/2019 / 12:05 PM Pt brought to Fluoro suite w/ parents and RPPU RN escort. Attempted to placate pt w/ videos but unable to get pt to sit  on stretcher. Pt becoming increasingly agitated, eventually was restrained by RN and father, Sevo 8% inhalational indxn successful, pt quickly laid on stretcher and mask ventilation was established, SpO2 100%, 18g PIV R forearm established, pt intubated w/o issue. Moved to procedure table... 1251: Pt to PACU on 4L [place]. Monitors connected, VSS. Airway patent, pain controlled. Emerged, extubated, transported to PACU w/o event. Father awaiting pt in PACU.    06/17/2016 [facility]:   Perineal exam under anesthesia, anesthetic block of the anus with Exparel, anorectal stool disimpaction of over 30 minutes of calcified stool    Mom reported he may be combative in flight or fight mode and to have extra people on hand. Needs to be sedated.    Anesthesia Plan: Pt had seizure activity after last GA with a ketamine dart. Pt would not take his po valium  this am for his parents. Tried to get pt to drink some po versed  mixed with sugar to try and alleviate the bitterness, but he would not take any. Plan to take pt back to the OR with parents for mask induction.   1410: Patient walked to OR with parents and OR staff. Mask inhalation induction. IV placed in left arm. Breathing spontaneously with mask support. VSS... 1453: Case longer than expected. LMA placed. Easy LMA placement. BBS = with good air exchange. Mouth and teeth in preoperative condition. VSS.   05/28/2015 [facility]   Dental surgeon, molar extractions   Parents gave Valium  prior to arrival 1050: 350 ( 4mg /kg ) mg IM ketamine given in preop, pt extremely anxious and combative. Ketamine took @ 10 minutes to work. Pt then placed on bed and brought back to OR 40. 20g PIV placed in  left hand. IV induction 7.0    ETT: 6.5; Cuffed, Nasal, RAE; Easy; No; Laryngoscope; Miller; 2; 1; +EtCo2, Breathing Equal and Symmetric; None; Nasal; 1   05/28/15:  Final Anesthetic Type: General  Patient Evaluation Location: PACU  Patient Participation: Patient participated  Level  of Consciousness: awake and alert and oriented  Pain Management: adequately controlled during entire PACU stay  Nausea/Vomiting: None  Post-op vitals signs: Stable  Post op temperature: Normothermic  Cardiovascular Status: hemodynamically stable  Respiratory Status: Stable, room air, spontaneous  Postoperative Hydration: adequately hydrated  Post op disposition: Home  Anesthetic Complications: no anesthesia complications noted  Additional Comments: Sleepy, eyes open, calm. Mom says she is good with d/c  Electronically signed by: Lynwood Fairy Rummer, MD 05/28/15 1504     Most recently, pt required general anesthesia for dental work on 05/27/21. Per documentation by anesthesiologist Dr. Paul, Patient and his parents met in parking lot because parents were unable to get him out of the car by themselves due to patient resistance/refusal. Patient was prescribed Halcion to take prior to arrival but refused all meds this morning (including antiepileptic). Hospital security officers present to assist as well as preop RN and CRNAs. Security able to get patient out of car with some resistance and brought to short stay. Patient refused attempt at oral versed . Plan discussed with parents. Will attempt intranasal precedex . If needed, will give additional sedation via IM route.     Lynwood Geofm RIGGERS [facility] Short Stay Center/Anesthesiology Phone [phone] 07/19/2024 10:12 AM

## 2024-07-19 NOTE — Anesthesia Preprocedure Evaluation (Addendum)
"                                    Anesthesia Evaluation  Patient identified by MRN, date of birth, ID band Patient awake    Reviewed: Patient's Chart, lab work & pertinent test results, Unable to perform ROS - Chart review only  History of Anesthesia Complications Negative for: history of anesthetic complications  Airway Mallampati: Unable to assess  TM Distance: >3 FB Neck ROM: Full    Dental  (+) Poor Dentition   Pulmonary neg pulmonary ROS   Pulmonary exam normal breath sounds clear to auscultation       Cardiovascular negative cardio ROS Normal cardiovascular exam Rhythm:Regular Rate:Normal     Neuro/Psych Seizures -, Well Controlled,  PSYCHIATRIC DISORDERS Anxiety     Autism spectrum disorderAutism Spectrum Disorder Convulsive Epilepsy    GI/Hepatic Neg liver ROS,GERD  ,,Dental caries   Endo/Other  negative endocrine ROS    Renal/GU negative Renal ROS  negative genitourinary   Musculoskeletal negative musculoskeletal ROS (+)    Abdominal   Peds  Hematology negative hematology ROS (+)   Anesthesia Other Findings   Reproductive/Obstetrics                              Anesthesia Physical Anesthesia Plan  ASA: 3  Anesthesia Plan: General   Post-op Pain Management: Ofirmev  IV (intra-op)*   Induction: Intravenous  PONV Risk Score and Plan: 2 and Ondansetron , Dexamethasone , Midazolam  and Treatment may vary due to age or medical condition  Airway Management Planned: Nasal ETT  Additional Equipment: None  Intra-op Plan:   Post-operative Plan: Extubation in OR  Informed Consent: I have reviewed the patients History and Physical, chart, labs and discussed the procedure including the risks, benefits and alternatives for the proposed anesthesia with the patient or authorized representative who has indicated his/her understanding and acceptance.     Dental advisory given  Plan Discussed with: CRNA and  Anesthesiologist  Anesthesia Plan Comments: (See PAT note by Lynwood Hope, PA-C      )         Anesthesia Quick Evaluation  "

## 2024-07-22 ENCOUNTER — Other Ambulatory Visit: Payer: Self-pay

## 2024-07-22 ENCOUNTER — Encounter (HOSPITAL_COMMUNITY): Payer: Self-pay

## 2024-07-22 NOTE — Progress Notes (Signed)
 SDW call  Patient's mom, Arland was given pre-op instructions over the phone. She verbalized understanding of instructions provided.  She denied patient having any SOB, fever or cough. Mom did warn that security may be needed to hold patient   PCP - Dr. Luke Manns Neurologist: Ellouise Richard pasture, NP Cardiologist -  Pulmonary:    PPM/ICD - denies Device Orders - na Rep Notified - na   Chest x-ray - 08/14/2023 EKG -   Stress Test - ECHO -  Cardiac Cath -  PFT: 12/16/2011  Sleep Study/sleep apnea/CPAP: denies  Non-diabetic  Blood Thinner Instructions: denies Aspirin Instructions:denies   ERAS Protcol -  NPO   Anesthesia review: Yes. Autisic, non-verbal, epilepsy, violent behavior  Your procedure is scheduled on Thursday July 25, 2024  Report to [facility] Main Entrance A at  0740  A.M., then check in with the Admitting office.  Call this number if you have problems the morning of surgery:  [phone]   If you have any questions prior to your surgery date call [phone]: Open Monday-Friday 8am-4pm If you experience any cold or flu symptoms such as cough, fever, chills, shortness of breath, etc. between now and your scheduled surgery, please notify us  at the above number     Remember:  Do not eat or drink after midnight the night before your surgery  Take these medicines the morning of surgery with A SIP OF WATER:  If can take with sip of water only: lamotrigine , risperdal , zoloft   As needed: Valium , valtoco   As of today, STOP taking any Aspirin (unless otherwise instructed by your surgeon) Aleve, Naproxen, Ibuprofen, Motrin, Advil, Goody's, BC's, all herbal medications, fish oil, and all vitamins.

## 2024-07-23 ENCOUNTER — Encounter (INDEPENDENT_AMBULATORY_CARE_PROVIDER_SITE_OTHER): Payer: Self-pay

## 2024-07-23 ENCOUNTER — Telehealth (INDEPENDENT_AMBULATORY_CARE_PROVIDER_SITE_OTHER): Payer: Self-pay

## 2024-07-23 DIAGNOSIS — Z79899 Other long term (current) drug therapy: Secondary | ICD-10-CM

## 2024-07-23 DIAGNOSIS — F419 Anxiety disorder, unspecified: Secondary | ICD-10-CM

## 2024-07-23 DIAGNOSIS — G40309 Generalized idiopathic epilepsy and epileptic syndromes, not intractable, without status epilepticus: Secondary | ICD-10-CM

## 2024-07-23 NOTE — Telephone Encounter (Signed)
 Received a call from [facility].   A representative by the name of Dorian Hamburg, OR scheduler, called to ask if some sedation medication could be prescribed for him to help keep him calm on the way to have the procedure done. Ms. Hamburg stated that security had to be involved the last time the procedure was attempted and the Dentist does not want to put the staff through that again.   She's asking if Mrs. Ellouise would be willing to prescribe something for him, that will work, given that she knows him best.   Ms. Dorian can be reached at [phone] if need be.  SS, CCMA

## 2024-07-24 ENCOUNTER — Other Ambulatory Visit: Payer: Self-pay

## 2024-07-24 MED ORDER — CLONAZEPAM 0.5 MG PO TBDP
ORAL_TABLET | ORAL | 0 refills | Status: AC
  Filled 2024-07-24: qty 2, 1d supply, fill #0

## 2024-07-24 MED ORDER — CLONAZEPAM 0.5 MG PO TBDP: ORAL_TABLET | ORAL | 0 refills | Status: DC

## 2024-07-24 NOTE — Telephone Encounter (Signed)
 I called and spoke with Ms Derrill. She will speak with the OR PA and call me back.

## 2024-07-24 NOTE — Addendum Note (Signed)
 Addended by: MARIANNA ELLOUISE SQUIBB on: 07/24/2024 11:46 AM   Modules accepted: Orders

## 2024-07-24 NOTE — Telephone Encounter (Signed)
 Antonio Silva called back and said that the OR approved for Antonio Silva to have a sip of clear juice or other clear liquid if needed in the morning to get his medication in. I sent a MyChart message to Mom.

## 2024-07-24 NOTE — Telephone Encounter (Signed)
 I called and spoke with Mom. We will try Clonazepam  ODT for Jamie's pre-procedural anxiety

## 2024-07-24 NOTE — Progress Notes (Signed)
 After discussion with anesthesia, decision made for patient to come in at 9:00 am. Spoke with mother, Arland, [phone]) and informed mother of updated arrival time.

## 2024-07-25 ENCOUNTER — Encounter (HOSPITAL_COMMUNITY)
Admission: RE | Disposition: A | Discharge status: Home / Self Care | Payer: Self-pay | Source: Home / Self Care | Attending: Dentistry

## 2024-07-25 ENCOUNTER — Encounter (HOSPITAL_COMMUNITY): Payer: Self-pay

## 2024-07-25 ENCOUNTER — Ambulatory Visit (HOSPITAL_COMMUNITY)
Admission: RE | Admit: 2024-07-25 | Discharge: 2024-07-25 | Disposition: A | Discharge status: Home / Self Care | Attending: Dentistry | Admitting: Dentistry

## 2024-07-25 ENCOUNTER — Ambulatory Visit (HOSPITAL_COMMUNITY): Payer: Self-pay | Admitting: Physician Assistant

## 2024-07-25 DIAGNOSIS — K029 Dental caries, unspecified: Secondary | ICD-10-CM | POA: Diagnosis not present

## 2024-07-25 DIAGNOSIS — F419 Anxiety disorder, unspecified: Secondary | ICD-10-CM

## 2024-07-25 DIAGNOSIS — K5909 Other constipation: Secondary | ICD-10-CM | POA: Diagnosis not present

## 2024-07-25 DIAGNOSIS — F840 Autistic disorder: Secondary | ICD-10-CM

## 2024-07-25 DIAGNOSIS — Z79899 Other long term (current) drug therapy: Secondary | ICD-10-CM | POA: Diagnosis not present

## 2024-07-25 DIAGNOSIS — F79 Unspecified intellectual disabilities: Secondary | ICD-10-CM | POA: Diagnosis not present

## 2024-07-25 DIAGNOSIS — G40409 Other generalized epilepsy and epileptic syndromes, not intractable, without status epilepticus: Secondary | ICD-10-CM | POA: Insufficient documentation

## 2024-07-25 DIAGNOSIS — R1084 Generalized abdominal pain: Secondary | ICD-10-CM | POA: Diagnosis not present

## 2024-07-25 HISTORY — PX: TOOTH EXTRACTION: SHX859

## 2024-07-25 LAB — IRON AND TIBC
Iron: 54 ug/dL (ref 45–182)
Saturation Ratios: 14 % — ABNORMAL LOW (ref 17.9–39.5)
TIBC: 392 ug/dL (ref 250–450)
UIBC: 338 ug/dL

## 2024-07-25 LAB — CBC WITH DIFFERENTIAL/PLATELET
Abs Immature Granulocytes: 0.02 K/uL (ref 0.00–0.07)
Basophils Absolute: 0 K/uL (ref 0.0–0.1)
Basophils Relative: 0 %
Eosinophils Absolute: 0 K/uL (ref 0.0–0.5)
Eosinophils Relative: 0 %
HCT: 37.2 % — ABNORMAL LOW (ref 39.0–52.0)
Hemoglobin: 13.3 g/dL (ref 13.0–17.0)
Immature Granulocytes: 0 %
Lymphocytes Relative: 14 %
Lymphs Abs: 0.7 K/uL (ref 0.7–4.0)
MCH: 33.2 pg (ref 26.0–34.0)
MCHC: 35.8 g/dL (ref 30.0–36.0)
MCV: 92.8 fL (ref 80.0–100.0)
Monocytes Absolute: 0.3 K/uL (ref 0.1–1.0)
Monocytes Relative: 5 %
Neutro Abs: 4 K/uL (ref 1.7–7.7)
Neutrophils Relative %: 81 %
Platelets: 195 K/uL (ref 150–400)
RBC: 4.01 MIL/uL — ABNORMAL LOW (ref 4.22–5.81)
RDW: 12.4 % (ref 11.5–15.5)
WBC: 5 K/uL (ref 4.0–10.5)
nRBC: 0 % (ref 0.0–0.2)

## 2024-07-25 LAB — COMPREHENSIVE METABOLIC PANEL WITH GFR
ALT: 32 U/L (ref 0–44)
AST: 32 U/L (ref 15–41)
Albumin: 4.5 g/dL (ref 3.5–5.0)
Alkaline Phosphatase: 105 U/L (ref 38–126)
Anion gap: 15 (ref 5–15)
BUN: 8 mg/dL (ref 6–20)
CO2: 19 mmol/L — ABNORMAL LOW (ref 22–32)
Calcium: 9.6 mg/dL (ref 8.9–10.3)
Chloride: 102 mmol/L (ref 98–111)
Creatinine, Ser: 0.92 mg/dL (ref 0.61–1.24)
GFR, Estimated: >60 mL/min
Glucose, Bld: 122 mg/dL — ABNORMAL HIGH (ref 70–99)
Potassium: 4.3 mmol/L (ref 3.5–5.1)
Sodium: 137 mmol/L (ref 135–145)
Total Bilirubin: 0.6 mg/dL (ref 0.0–1.2)
Total Protein: 7.1 g/dL (ref 6.5–8.1)

## 2024-07-25 LAB — LIPID PANEL
Cholesterol: 171 mg/dL (ref 0–200)
HDL: 41 mg/dL
LDL Cholesterol: 99 mg/dL (ref 0–99)
Total CHOL/HDL Ratio: 4.2 ratio
Triglycerides: 157 mg/dL — ABNORMAL HIGH
VLDL: 31 mg/dL (ref 0–40)

## 2024-07-25 LAB — VITAMIN D 25 HYDROXY (VIT D DEFICIENCY, FRACTURES): Vit D, 25-Hydroxy: 30.4 ng/mL (ref 30–100)

## 2024-07-25 LAB — HEMOGLOBIN A1C
Hgb A1c MFr Bld: 5.7 % — ABNORMAL HIGH (ref 4.8–5.6)
Mean Plasma Glucose: 116.89 mg/dL

## 2024-07-25 LAB — TSH: TSH: 2.95 u[IU]/mL (ref 0.350–4.500)

## 2024-07-25 LAB — VITAMIN B12: Vitamin B-12: 579 pg/mL (ref 180–914)

## 2024-07-25 SURGERY — DENTAL RESTORATION/EXTRACTIONS
Anesthesia: General | Site: Mouth

## 2024-07-25 MED ORDER — ROCURONIUM BROMIDE 10 MG/ML (PF) SYRINGE
PREFILLED_SYRINGE | INTRAVENOUS | Status: AC
  Filled 2024-07-25: qty 10

## 2024-07-25 MED ORDER — FENTANYL CITRATE (PF) 100 MCG/2ML IJ SOLN
INTRAMUSCULAR | Status: AC
  Filled 2024-07-25: qty 2

## 2024-07-25 MED ORDER — FENTANYL CITRATE (PF) 100 MCG/2ML IJ SOLN: 25.0000 ug | INTRAMUSCULAR | Status: DC | PRN

## 2024-07-25 MED ORDER — PROPOFOL 10 MG/ML IV BOLUS
INTRAVENOUS | Status: DC | PRN
  Administered 2024-07-25: 200 mg via INTRAVENOUS

## 2024-07-25 MED ORDER — HYDROMORPHONE HCL 1 MG/ML IJ SOLN
INTRAMUSCULAR | Status: AC
  Filled 2024-07-25: qty 0.5

## 2024-07-25 MED ORDER — DEXMEDETOMIDINE HCL IN NACL 80 MCG/20ML IV SOLN
INTRAVENOUS | Status: DC | PRN
  Administered 2024-07-25 (×2): 8 ug via INTRAVENOUS

## 2024-07-25 MED ORDER — ONDANSETRON HCL 4 MG/2ML IJ SOLN
INTRAMUSCULAR | Status: AC
  Filled 2024-07-25: qty 2

## 2024-07-25 MED ORDER — ROCURONIUM BROMIDE 10 MG/ML (PF) SYRINGE
PREFILLED_SYRINGE | INTRAVENOUS | Status: DC | PRN
  Administered 2024-07-25: 20 mg via INTRAVENOUS
  Administered 2024-07-25: 60 mg via INTRAVENOUS
  Administered 2024-07-25: 10 mg via INTRAVENOUS

## 2024-07-25 MED ORDER — DEXAMETHASONE SOD PHOSPHATE PF 10 MG/ML IJ SOLN
INTRAMUSCULAR | Status: DC | PRN
  Administered 2024-07-25: 5 mg via INTRAVENOUS

## 2024-07-25 MED ORDER — CEFAZOLIN SODIUM-DEXTROSE 2-3 GM-%(50ML) IV SOLR
INTRAVENOUS | Status: DC | PRN
  Administered 2024-07-25: 2 g via INTRAVENOUS

## 2024-07-25 MED ORDER — HALOPERIDOL LACTATE 5 MG/ML IJ SOLN
15.0000 mg | Freq: Once | INTRAMUSCULAR | Status: DC
  Filled 2024-07-25: qty 3

## 2024-07-25 MED ORDER — KETOROLAC TROMETHAMINE 30 MG/ML IJ SOLN
INTRAMUSCULAR | Status: DC | PRN
  Administered 2024-07-25: 15 mg via INTRAVENOUS

## 2024-07-25 MED ORDER — ONDANSETRON HCL 4 MG/2ML IJ SOLN
INTRAMUSCULAR | Status: DC | PRN
  Administered 2024-07-25: 4 mg via INTRAVENOUS

## 2024-07-25 MED ORDER — DROPERIDOL 2.5 MG/ML IJ SOLN
INTRAMUSCULAR | Status: AC
  Filled 2024-07-25: qty 2

## 2024-07-25 MED ORDER — MIDAZOLAM HCL (PF) 2 MG/2ML IJ SOLN
INTRAMUSCULAR | Status: DC | PRN
  Administered 2024-07-25: 10 mg via INTRAVENOUS

## 2024-07-25 MED ORDER — LIDOCAINE-EPINEPHRINE 2 %-1:100000 IJ SOLN
INTRAMUSCULAR | Status: AC
  Filled 2024-07-25: qty 6.8

## 2024-07-25 MED ORDER — LIDOCAINE 2% (20 MG/ML) 5 ML SYRINGE
INTRAMUSCULAR | Status: DC | PRN
  Administered 2024-07-25: 60 mg via INTRAVENOUS

## 2024-07-25 MED ORDER — CHLORHEXIDINE GLUCONATE 0.12 % MT SOLN: 15.0000 mL | Freq: Once | OROMUCOSAL | Status: DC

## 2024-07-25 MED ORDER — MIDAZOLAM HCL (PF) 2 MG/2ML IJ SOLN: INTRAMUSCULAR | Status: DC | PRN

## 2024-07-25 MED ORDER — PROPOFOL 10 MG/ML IV BOLUS
INTRAVENOUS | Status: AC
  Filled 2024-07-25: qty 20

## 2024-07-25 MED ORDER — ORAL CARE MOUTH RINSE: 15.0000 mL | Freq: Once | OROMUCOSAL | Status: DC

## 2024-07-25 MED ORDER — OXYCODONE HCL 5 MG/5ML PO SOLN: 5.0000 mg | Freq: Once | ORAL | Status: DC | PRN

## 2024-07-25 MED ORDER — LIDOCAINE-EPINEPHRINE 2 %-1:100000 IJ SOLN
INTRAMUSCULAR | Status: DC | PRN
  Administered 2024-07-25: 1.7 mL via INTRADERMAL

## 2024-07-25 MED ORDER — MIDAZOLAM HCL (PF) 10 MG/2ML IJ SOLN
10.0000 mg | Freq: Once | INTRAMUSCULAR | Status: DC
  Filled 2024-07-25: qty 10
  Filled 2024-07-25: qty 2

## 2024-07-25 MED ORDER — OXYMETAZOLINE HCL 0.05 % NA SOLN
NASAL | Status: DC | PRN
  Administered 2024-07-25: 4 via NASAL

## 2024-07-25 MED ORDER — LIDOCAINE 2% (20 MG/ML) 5 ML SYRINGE
INTRAMUSCULAR | Status: AC
  Filled 2024-07-25: qty 5

## 2024-07-25 MED ORDER — HEMOSTATIC AGENTS (NO CHARGE) OPTIME
TOPICAL | Status: DC | PRN
  Administered 2024-07-25: 1 via TOPICAL

## 2024-07-25 MED ORDER — FENTANYL CITRATE (PF) 250 MCG/5ML IJ SOLN
INTRAMUSCULAR | Status: DC | PRN
  Administered 2024-07-25: 100 ug via INTRAVENOUS

## 2024-07-25 MED ORDER — STERILE WATER FOR IRRIGATION IR SOLN
Status: DC | PRN
  Administered 2024-07-25: 1000 mL

## 2024-07-25 MED ORDER — OXYCODONE HCL 5 MG PO TABS: 5.0000 mg | ORAL_TABLET | Freq: Once | ORAL | Status: DC | PRN

## 2024-07-25 MED ORDER — SUGAMMADEX SODIUM 200 MG/2ML IV SOLN
INTRAVENOUS | Status: DC | PRN
  Administered 2024-07-25: 200 mg via INTRAVENOUS

## 2024-07-25 MED ORDER — DROPERIDOL 2.5 MG/ML IJ SOLN
0.6250 mg | Freq: Once | INTRAMUSCULAR | Status: AC | PRN
  Administered 2024-07-25: 0.625 mg via INTRAVENOUS

## 2024-07-25 MED ORDER — LACTATED RINGERS IV SOLN: INTRAVENOUS | Status: DC

## 2024-07-25 MED ORDER — ACETAMINOPHEN 10 MG/ML IV SOLN
INTRAVENOUS | Status: DC | PRN
  Administered 2024-07-25: 1000 mg via INTRAVENOUS

## 2024-07-25 MED ORDER — ACETAMINOPHEN 10 MG/ML IV SOLN: 1000.0000 mg | Freq: Once | INTRAVENOUS | Status: DC | PRN

## 2024-07-25 MED ORDER — HYDROMORPHONE HCL 1 MG/ML IJ SOLN
INTRAMUSCULAR | Status: DC | PRN
  Administered 2024-07-25: .5 mg via INTRAVENOUS

## 2024-07-25 SURGICAL SUPPLY — 23 items
BLADE SURG 15 STRL LF DISP TIS (BLADE) ×1 IMPLANT
CANISTER SUCTION 3000ML PPV (SUCTIONS) ×1 IMPLANT
COVER BACK TABLE 60X90IN (DRAPES) ×1 IMPLANT
COVER MAYO STAND STRL (DRAPES) ×1 IMPLANT
COVER SURGICAL LIGHT HANDLE (MISCELLANEOUS) ×1 IMPLANT
DRAPE HALF SHEET 40X57 (DRAPES) ×1 IMPLANT
GAUZE PACKING FOLDED 2 STR (GAUZE/BANDAGES/DRESSINGS) ×1 IMPLANT
GAUZE SPONGE 4X4 16PLY XRAY LF (GAUZE/BANDAGES/DRESSINGS) ×1 IMPLANT
GLOVE BIO SURGEON STRL SZ 6.5 (GLOVE) ×1 IMPLANT
GOWN STRL REUS W/ TWL LRG LVL3 (GOWN DISPOSABLE) ×2 IMPLANT
KIT BASIN OR (CUSTOM PROCEDURE TRAY) ×1 IMPLANT
KIT TURNOVER KIT B (KITS) ×1 IMPLANT
NEEDLE DENTAL 27 LONG (NEEDLE) ×1 IMPLANT
PAD ARMBOARD POSITIONER FOAM (MISCELLANEOUS) ×2 IMPLANT
SPONGE SURGIFOAM ABS GEL SZ50 (HEMOSTASIS) IMPLANT
SUT CHROMIC 3 0 PS 2 (SUTURE) ×1 IMPLANT
SUT CHROMIC 3 0 SH 27 (SUTURE) IMPLANT
SYR BULB IRRIG 60ML STRL (SYRINGE) ×1 IMPLANT
TOOTHBRUSH ADULT (PERSONAL CARE ITEMS) ×1 IMPLANT
TOWEL GREEN STERILE (TOWEL DISPOSABLE) ×1 IMPLANT
TUBE CONNECTING 12X1/4 (SUCTIONS) ×1 IMPLANT
WATER TABLETS ICX (MISCELLANEOUS) ×1 IMPLANT
YANKAUER SUCT BULB TIP NO VENT (SUCTIONS) ×1 IMPLANT

## 2024-07-25 NOTE — Anesthesia Procedure Notes (Signed)
 Procedure Name: Intubation Date/Time: 07/25/2024 12:27 PM  Performed by: Mannie Krystal LABOR, CRNAPre-anesthesia Checklist: Patient identified, Emergency Drugs available, Suction available and Patient being monitored Patient Re-evaluated:Patient Re-evaluated prior to induction Oxygen Delivery Method: Circle system utilized Preoxygenation: Pre-oxygenation with 100% oxygen Induction Type: IV induction Ventilation: Mask ventilation without difficulty Laryngoscope Size: Mac and 3 Grade View: Grade I Nasal Tubes: Nasal prep performed and Nasal Rae Number of attempts: 1 Airway Equipment and Method: Stylet and Oral airway Placement Confirmation: ETT inserted through vocal cords under direct vision, positive ETCO2 and breath sounds checked- equal and bilateral Tube secured with: Tape Dental Injury: Teeth and Oropharynx as per pre-operative assessment

## 2024-07-25 NOTE — Transfer of Care (Signed)
 Immediate Anesthesia Transfer of Care Note  Patient: Antonio Silva  Procedure(s) Performed: Full Mouth Debridement, Exam, X-Ray, Surgical Extraction teeth # 32, Resin filling teeth #2,3,28,29 (Mouth)  Patient Location: PACU  Anesthesia Type:General  Level of Consciousness: drowsy  Airway & Oxygen Therapy: Patient Spontanous Breathing and Patient connected to face mask oxygen  Post-op Assessment: Report given to RN and Post -op Vital signs reviewed and stable  Post vital signs: Reviewed and stable  Last Vitals:  Vitals Value Taken Time  BP 107/45 07/25/24 15:01  Temp    Pulse 71 07/25/24 15:05  Resp 14 07/25/24 15:05  SpO2 97 % 07/25/24 15:05  Vitals shown include unfiled device data.  Last Pain: There were no vitals filed for this visit.       Complications: No notable events documented.

## 2024-07-25 NOTE — Op Note (Signed)
 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 Dental Care provided in OR for medically necessary treatment.  Surgeon: Margarito VEAR Councilman, DMD  Assistant: April Riggsbee and hospital staff.  Anesthesia: General  Procedure: The patient was brought into the operating room and placed on the table in a supine position.  General anesthesia was administered via nasal intubation.  The patient was prepped and draped in the usual manner for an intra-oral general dentistry procedure. The oropharynx was suctioned and a moistened oropharyngeal throat pack was placed.    A full intra-oral exam including all hard and soft tissues was performed.  Type of Exam: Recall   Soft Tissue Exam: Floor of the mouth: Normal Buccal mucosa: Normal Soft palate: Normal Hard palate: Normal Tongue: Normal Gingival: Normal Frenum: Normal  Hard tissue exam:  Present: # 2-15, 18-32 Missing: # 1, 16, 17 Un-erupted: # 32 Radiographic findings decay: NA   Full mouth series of digital radiographs taken and reviewed. A comprehensive treatment plan was developed.  Operative care was accomplished in a standard fashion using high/low speed drills with copious irrigation.  Routine extractions were accomplished with simple elevation and use of forceps. Surgical Extractions were done in a standard fashion with full facial thickness flaps to gain access, otectomy / osteoplasty with copious irrigation to expose the teeth. Teeth with multi-roots were sectioned as needed to minimize surgical trauma.   All surgical sites were irrigated with copious amounts of saline. Gel foam was placed in the sockets and hemostasis established with firm pressure. Surgical sites were closed with 3-0 Chromic sutures.  Local Anes:Lidocaine  2% with 1:100,000 epinephrine  1.7 mls The estimated blood loss was 50 mls.   Upon  completion of all procedures the oropharynx was irrigated of all debris. Mouth was suctioned dry and a posterior throat pack was carefully removed with constant suction. Hemostasis was established and a gauze pack was placed as an intraoral pressure dressing. After spontaneous respirations the patient was extubated and transported to the Post-Anesthesia care unit in awake but in a sedated condition. The patient tolerated the procedure well and without complications.  An explanation of procedures and extractions were given to parents.  Operative Procedures:  Full mouth debridement: Yes Extractions completed: # 32 Glass Ionomers: # 2L (coronal/smooth/dentin), 3OL (corona/chewing/dentin), 18O (corona/chewing/dentin), 29O(corona/chewing/dentin), Fluoride varnish: Yes   Postoperative Meds:  Ibuprofen 600mg  and tylenol  500mg  every 6 hours if needed for pain  Postoperative Instructions: Extraction sheet signed and given to patient representative.    Margarito VEAR Councilman, DMD

## 2024-07-25 NOTE — Anesthesia Postprocedure Evaluation (Signed)
"   Anesthesia Post Note  Patient: Antonio Silva  Procedure(s) Performed: Full Mouth Debridement, Exam, X-Ray, Surgical Extraction teeth # 32, Resin filling teeth #2,3,28,29 (Mouth)     Patient location during evaluation: PACU Anesthesia Type: General Level of consciousness: awake and alert, oriented and patient cooperative Pain management: pain level controlled Vital Signs Assessment: post-procedure vital signs reviewed and stable Respiratory status: spontaneous breathing, nonlabored ventilation and respiratory function stable Cardiovascular status: blood pressure returned to baseline and stable Postop Assessment: no apparent nausea or vomiting Anesthetic complications: no   No notable events documented.  Last Vitals:  Vitals:   07/25/24 1545 07/25/24 1600  BP: 130/80 126/77  Pulse: (!) 112 (!) 116  Resp: 12 17  Temp:  36.5 C  SpO2: 95% 96%    Last Pain:  Vitals:   07/25/24 1530  PainSc: Asleep                 Almarie HERO Zeina Akkerman      "

## 2024-07-25 NOTE — H&P (Signed)
 H&P reviewed. Stable for surgery Margarito VEAR Councilman, DMD

## 2024-07-25 NOTE — Brief Op Note (Signed)
 07/25/2024 10:12 AM  2:50 PM  PATIENT:  Antonio Silva  29 y.o. male  PRE-OPERATIVE DIAGNOSIS:  dental caries  POST-OPERATIVE DIAGNOSIS:  dental caries  PROCEDURE:  Procedures: Full Mouth Debridement, Exam, X-Ray, Surgical Extraction teeth # 32, Resin filling teeth #2,3,28,29 (N/A)  SURGEON:  Surgeons and Role:    DEWAINE Bonnell Margarito VEAR, DMD - Primary  PHYSICIAN ASSISTANT:   ASSISTANTS: April Riggsbee   ANESTHESIA:   general  EBL:  50ml  BLOOD ADMINISTERED:none  DRAINS: none   LOCAL MEDICATIONS USED:  LIDOCAINE   and Amount: 1.7 ml  SPECIMEN:  No Specimen  DISPOSITION OF SPECIMEN:  N/A  COUNTS:  YES  TOURNIQUET:  * No tourniquets in log *  DICTATION: .Note written in EPIC  PLAN OF CARE: Discharge to home after PACU  PATIENT DISPOSITION:  PACU - hemodynamically stable.   Delay start of Pharmacological VTE agent (>24hrs) due to surgical blood loss or risk of bleeding: not applicable

## 2024-07-26 ENCOUNTER — Encounter (HOSPITAL_COMMUNITY): Payer: Self-pay | Admitting: Dentistry

## 2024-07-26 LAB — LAMOTRIGINE LEVEL: Lamotrigine Lvl: 7 ug/mL (ref 2.0–20.0)
